# Patient Record
Sex: Female | Born: 1995 | Race: Black or African American | Hispanic: No | Marital: Single | Smoking: Never smoker
Health system: Southern US, Community
[De-identification: ages and names within clinical notes are randomized; demographics above are authoritative.]

## PROBLEM LIST (undated history)

## (undated) DIAGNOSIS — N83209 Unspecified ovarian cyst, unspecified side: Secondary | ICD-10-CM

## (undated) HISTORY — DX: Unspecified ovarian cyst, unspecified side: N83.209

## (undated) HISTORY — PX: NO PAST SURGERIES: SHX2092

## (undated) HISTORY — PX: THERAPEUTIC ABORTION: SHX798

---

## 2015-04-07 ENCOUNTER — Emergency Department (HOSPITAL_COMMUNITY)
Admission: EM | Admit: 2015-04-07 | Discharge: 2015-04-07 | Disposition: A | Discharge status: Home / Self Care | Payer: Medicaid Other | Attending: Emergency Medicine | Admitting: Emergency Medicine

## 2015-04-07 ENCOUNTER — Encounter (HOSPITAL_COMMUNITY): Payer: Self-pay | Admitting: Emergency Medicine

## 2015-04-07 ENCOUNTER — Emergency Department (HOSPITAL_COMMUNITY): Payer: Medicaid Other

## 2015-04-07 DIAGNOSIS — N83201 Unspecified ovarian cyst, right side: Secondary | ICD-10-CM | POA: Insufficient documentation

## 2015-04-07 DIAGNOSIS — Z3202 Encounter for pregnancy test, result negative: Secondary | ICD-10-CM | POA: Insufficient documentation

## 2015-04-07 DIAGNOSIS — R1031 Right lower quadrant pain: Secondary | ICD-10-CM | POA: Diagnosis present

## 2015-04-07 DIAGNOSIS — N83209 Unspecified ovarian cyst, unspecified side: Secondary | ICD-10-CM

## 2015-04-07 LAB — CBC WITH DIFFERENTIAL/PLATELET
BASOS ABS: 0 10*3/uL (ref 0.0–0.1)
Basophils Relative: 0 %
EOS PCT: 1 %
Eosinophils Absolute: 0.1 10*3/uL (ref 0.0–0.7)
HCT: 32.7 % — ABNORMAL LOW (ref 36.0–46.0)
Hemoglobin: 11 g/dL — ABNORMAL LOW (ref 12.0–15.0)
LYMPHS PCT: 14 %
Lymphs Abs: 1.5 10*3/uL (ref 0.7–4.0)
MCH: 28.2 pg (ref 26.0–34.0)
MCHC: 33.6 g/dL (ref 30.0–36.0)
MCV: 83.8 fL (ref 78.0–100.0)
MONO ABS: 0.9 10*3/uL (ref 0.1–1.0)
Monocytes Relative: 9 %
Neutro Abs: 8.2 10*3/uL — ABNORMAL HIGH (ref 1.7–7.7)
Neutrophils Relative %: 76 %
PLATELETS: 234 10*3/uL (ref 150–400)
RBC: 3.9 MIL/uL (ref 3.87–5.11)
RDW: 14.6 % (ref 11.5–15.5)
WBC: 10.7 10*3/uL — ABNORMAL HIGH (ref 4.0–10.5)

## 2015-04-07 LAB — COMPREHENSIVE METABOLIC PANEL
ALK PHOS: 60 U/L (ref 38–126)
ALT: 12 U/L — AB (ref 14–54)
AST: 17 U/L (ref 15–41)
Albumin: 4.1 g/dL (ref 3.5–5.0)
Anion gap: 5 (ref 5–15)
BILIRUBIN TOTAL: 0.7 mg/dL (ref 0.3–1.2)
BUN: 10 mg/dL (ref 6–20)
CO2: 29 mmol/L (ref 22–32)
CREATININE: 0.56 mg/dL (ref 0.44–1.00)
Calcium: 9.4 mg/dL (ref 8.9–10.3)
Chloride: 103 mmol/L (ref 101–111)
GFR calc Af Amer: >60 mL/min (ref >60)
Glucose, Bld: 88 mg/dL (ref 65–99)
Potassium: 3.8 mmol/L (ref 3.5–5.1)
Sodium: 137 mmol/L (ref 135–145)
TOTAL PROTEIN: 7.9 g/dL (ref 6.5–8.1)

## 2015-04-07 LAB — URINALYSIS, ROUTINE W REFLEX MICROSCOPIC
Bilirubin Urine: NEGATIVE
GLUCOSE, UA: NEGATIVE mg/dL
HGB URINE DIPSTICK: NEGATIVE
KETONES UR: NEGATIVE mg/dL
Leukocytes, UA: NEGATIVE
Nitrite: NEGATIVE
PROTEIN: NEGATIVE mg/dL
Specific Gravity, Urine: 1.015 (ref 1.005–1.030)
Urobilinogen, UA: 0.2 mg/dL (ref 0.0–1.0)
pH: 6 (ref 5.0–8.0)

## 2015-04-07 LAB — POC URINE PREG, ED: Preg Test, Ur: NEGATIVE

## 2015-04-07 MED ORDER — IOHEXOL 300 MG/ML  SOLN
50.0000 mL | Freq: Once | INTRAMUSCULAR | Status: AC | PRN
  Administered 2015-04-07: 50 mL via ORAL

## 2015-04-07 MED ORDER — IOHEXOL 300 MG/ML  SOLN
100.0000 mL | Freq: Once | INTRAMUSCULAR | Status: AC | PRN
  Administered 2015-04-07: 100 mL via INTRAVENOUS

## 2015-04-07 NOTE — ED Notes (Signed)
Pt from home c/o generalized abdominal pain and pain when having a stool or urinating since yesterday. Pt is unable to describe this pain but reports no dysuria. Reports nausea but denies vomiting or diarrhea

## 2015-04-07 NOTE — ED Provider Notes (Signed)
CSN: [id number]     Arrival date & time 04/07/15  [id number] History   First MD Initiated Contact with Patient 04/07/15 [phone]     Chief Complaint  Patient presents with  . Abdominal Pain     The history is provided by the patient. No language interpreter was used.   Ms. Megan Strong is a 19 year old woman with no significant medical or surgical history presents for evaluation of abdominal pain. She reports 2 days of lower and right lower quadrant abdominal pain. The pain is sharp and stabbing in nature. Pain is worse with urination and bowel movements. She has associated nausea and constipation. She denies any fevers, vomiting, urinary frequency, vaginal discharge. She is not sexually active. No prior similar symptoms. Symptoms are moderate, constant, worsening. She took Advil, 400 mg this morning without any relief.  History reviewed. No pertinent past medical history. History reviewed. No pertinent past surgical history. No family history on file. Social History  Substance Use Topics  . Smoking status: Never Smoker   . Smokeless tobacco: None  . Alcohol Use: Yes     Comment: social   OB History    No data available     Review of Systems  All other systems reviewed and are negative.     Allergies  Review of patient's allergies indicates no known allergies.  Home Medications   Prior to Admission medications   Not on File   BP 112/71 mmHg  Pulse 84  Temp(Src) 98.2 F (36.8 C) (Oral)  Resp 18  Wt 108 lb (48.988 kg)  SpO2 99%  LMP 02/27/2015 Physical Exam  Constitutional: She is oriented to person, place, and time. She appears well-developed and well-nourished.  HENT:  Head: Normocephalic and atraumatic.  Cardiovascular: Normal rate and regular rhythm.   No murmur heard. Pulmonary/Chest: Effort normal and breath sounds normal. No respiratory distress.  Abdominal: Soft.  Mild to moderate right lower quadrant tenderness without guarding or rebound  Musculoskeletal: She  exhibits no edema or tenderness.  Neurological: She is alert and oriented to person, place, and time.  Skin: Skin is warm and dry.  Psychiatric: She has a normal mood and affect. Her behavior is normal.  Nursing note and vitals reviewed.   ED Course  Procedures (including critical care time) Labs Review Labs Reviewed  COMPREHENSIVE METABOLIC PANEL - Abnormal; Notable for the following:    ALT 12 (*)    All other components within normal limits  URINALYSIS, ROUTINE W REFLEX MICROSCOPIC (NOT AT [facility]) - Abnormal; Notable for the following:    APPearance CLOUDY (*)    All other components within normal limits  CBC WITH DIFFERENTIAL/PLATELET - Abnormal; Notable for the following:    WBC 10.7 (*)    Hemoglobin 11.0 (*)    HCT 32.7 (*)    Neutro Abs 8.2 (*)    All other components within normal limits  POC URINE PREG, ED    Imaging Review No results found. I have personally reviewed and evaluated these images and lab results as part of my medical decision-making.   EKG Interpretation None      MDM   Final diagnoses:  Ovarian cyst rupture    Patient here for evaluation of lower abdominal pain/right lower quadrant pain. CT scan obtained to rule out appendicitis. CT with no evidence of appendicitis but does demonstrate likely ovarian cyst rupture. Patient without peritoneal signs on examination, presentation are consistent with torsion, hemodynamically stable. Discussed with patient home care for ovarian cyst rupture  as well as outpatient follow-up.  Tilden Fossa, MD 04/07/15 1249

## 2015-04-07 NOTE — Discharge Instructions (Signed)
Ovarian Cyst An ovarian cyst is a fluid-filled sac that forms on an ovary. The ovaries are small organs that produce eggs in women. Various types of cysts can form on the ovaries. Most are not cancerous. Many do not cause problems, and they often go away on their own. Some may cause symptoms and require treatment. Common types of ovarian cysts include:  Functional cysts--These cysts may occur every month during the menstrual cycle. This is normal. The cysts usually go away with the next menstrual cycle if the woman does not get pregnant. Usually, there are no symptoms with a functional cyst.  Endometrioma cysts--These cysts form from the tissue that lines the uterus. They are also called "chocolate cysts" because they become filled with blood that turns brown. This type of cyst can cause pain in the lower abdomen during intercourse and with your menstrual period.  Cystadenoma cysts--This type develops from the cells on the outside of the ovary. These cysts can get very big and cause lower abdomen pain and pain with intercourse. This type of cyst can twist on itself, cut off its blood supply, and cause severe pain. It can also easily rupture and cause a lot of pain.  Dermoid cysts--This type of cyst is sometimes found in both ovaries. These cysts may contain different kinds of body tissue, such as skin, teeth, hair, or cartilage. They usually do not cause symptoms unless they get very big.  Theca lutein cysts--These cysts occur when too much of a certain hormone (human chorionic gonadotropin) is produced and overstimulates the ovaries to produce an egg. This is most common after procedures used to assist with the conception of a baby (in vitro fertilization). CAUSES   Fertility drugs can cause a condition in which multiple large cysts are formed on the ovaries. This is called ovarian hyperstimulation syndrome.  A condition called polycystic ovary syndrome can cause hormonal imbalances that can lead to  nonfunctional ovarian cysts. SIGNS AND SYMPTOMS  Many ovarian cysts do not cause symptoms. If symptoms are present, they may include:  Pelvic pain or pressure.  Pain in the lower abdomen.  Pain during sexual intercourse.  Increasing girth (swelling) of the abdomen.  Abnormal menstrual periods.  Increasing pain with menstrual periods.  Stopping having menstrual periods without being pregnant. DIAGNOSIS  These cysts are commonly found during a routine or annual pelvic exam. Tests may be ordered to find out more about the cyst. These tests may include:  Ultrasound.  X-ray of the pelvis.  CT scan.  MRI.  Blood tests. TREATMENT  Many ovarian cysts go away on their own without treatment. Your health care provider may want to check your cyst regularly for 2-3 months to see if it changes. For women in menopause, it is particularly important to monitor a cyst closely because of the higher rate of ovarian cancer in menopausal women. When treatment is needed, it may include any of the following:  A procedure to drain the cyst (aspiration). This may be done using a long needle and ultrasound. It can also be done through a laparoscopic procedure. This involves using a thin, lighted tube with a tiny camera on the end (laparoscope) inserted through a small incision.  Surgery to remove the whole cyst. This may be done using laparoscopic surgery or an open surgery involving a larger incision in the lower abdomen.  Hormone treatment or birth control pills. These methods are sometimes used to help dissolve a cyst. HOME CARE INSTRUCTIONS   Only take over-the-counter   or prescription medicines as directed by your health care provider.  Follow up with your health care provider as directed.  Get regular pelvic exams and Pap tests. SEEK MEDICAL CARE IF:   Your periods are late, irregular, or painful, or they stop.  Your pelvic pain or abdominal pain does not go away.  Your abdomen becomes  larger or swollen.  You have pressure on your bladder or trouble emptying your bladder completely.  You have pain during sexual intercourse.  You have feelings of fullness, pressure, or discomfort in your stomach.  You lose weight for no apparent reason.  You feel generally ill.  You become constipated.  You lose your appetite.  You develop acne.  You have an increase in body and facial hair.  You are gaining weight, without changing your exercise and eating habits.  You think you are pregnant. SEEK IMMEDIATE MEDICAL CARE IF:   You have increasing abdominal pain.  You feel sick to your stomach (nauseous), and you throw up (vomit).  You develop a fever that comes on suddenly.  You have abdominal pain during a bowel movement.  Your menstrual periods become heavier than usual. MAKE SURE YOU:  Understand these instructions.  Will watch your condition.  Will get help right away if you are not doing well or get worse.   This information is not intended to replace advice given to you by your health care provider. Make sure you discuss any questions you have with your health care provider.   Document Released: 05/13/2005 Document Revised: 05/18/2013 Document Reviewed: 01/18/2013 Elsevier Interactive Patient Education 2016 [employer]  

## 2015-05-17 NOTE — ED Provider Notes (Signed)
12:17 PM I received a call from a radiologist who was doing review of past CT scans. Patient was seen in ED 04/07/15 and had CT performed for right lower quadrant abdominal pain. Diagnosis was ruptured ovarian cyst. On re-read, there was question of early acute appendicitis. I was asked to follow-up with patient regarding her symptoms.   I called and spoke with Ms. Senkbeil and inquired about her abdominal pain. She states that her pain has improved. I explained the situation to her, that radiologist could not rule-out acute appendicitis. I instructed that if she is currently not having any pain, she likely only needs routine recheck. If pain returns, she should follow-up immediately with her doctor or emergency department. She verbalized understanding and thanked me for the call.     Renne CriglerJoshua Jaymison Luber, PA-C 05/17/15 1222  Tilden FossaElizabeth Rees, MD 05/19/15 1447

## 2015-05-28 DIAGNOSIS — N83209 Unspecified ovarian cyst, unspecified side: Secondary | ICD-10-CM

## 2015-05-28 HISTORY — DX: Unspecified ovarian cyst, unspecified side: N83.209

## 2015-06-24 ENCOUNTER — Emergency Department (HOSPITAL_COMMUNITY)
Admission: EM | Admit: 2015-06-24 | Discharge: 2015-06-24 | Disposition: A | Discharge status: Home / Self Care | Payer: Medicaid Other | Attending: Emergency Medicine | Admitting: Emergency Medicine

## 2015-06-24 ENCOUNTER — Emergency Department (HOSPITAL_COMMUNITY): Payer: Medicaid Other

## 2015-06-24 ENCOUNTER — Encounter (HOSPITAL_COMMUNITY): Payer: Self-pay | Admitting: Emergency Medicine

## 2015-06-24 DIAGNOSIS — R51 Headache: Secondary | ICD-10-CM | POA: Diagnosis present

## 2015-06-24 DIAGNOSIS — B349 Viral infection, unspecified: Secondary | ICD-10-CM | POA: Diagnosis not present

## 2015-06-24 DIAGNOSIS — R42 Dizziness and giddiness: Secondary | ICD-10-CM | POA: Diagnosis not present

## 2015-06-24 DIAGNOSIS — R519 Headache, unspecified: Secondary | ICD-10-CM

## 2015-06-24 DIAGNOSIS — Z79899 Other long term (current) drug therapy: Secondary | ICD-10-CM | POA: Diagnosis not present

## 2015-06-24 MED ORDER — KETOROLAC TROMETHAMINE 15 MG/ML IJ SOLN
15.0000 mg | Freq: Once | INTRAMUSCULAR | Status: AC
  Administered 2015-06-24: 15 mg via INTRAMUSCULAR
  Filled 2015-06-24: qty 1

## 2015-06-24 NOTE — ED Notes (Signed)
Patient here from home with complaints of nausea, headache x2 days. Dizziness. AAO x4.

## 2015-06-24 NOTE — Discharge Instructions (Signed)
Please read and follow all provided instructions.  Your diagnoses today include:  1. Viral syndrome   2. Nonintractable headache, unspecified chronicity pattern, unspecified headache type    Tests performed today include:  CT of your head which was normal and did not show any serious cause of your headache  Vital signs. See below for your results today.   Medications:  In the Emergency Department you received:  Toradol - NSAID medication similar to ibuprofen  Take any prescribed medications only as directed.  Additional information:  Follow any educational materials contained in this packet.  You are having a headache. No specific cause was found today for your headache. It may have been a migraine or other cause of headache. Stress, anxiety, fatigue, and depression are common triggers for headaches.   Your headache today does not appear to be life-threatening or require hospitalization, but often the exact cause of headaches is not determined in the emergency department. Therefore, follow-up with your doctor is very important to find out what may have caused your headache and whether or not you need any further diagnostic testing or treatment.   Sometimes headaches can appear benign (not harmful), but then more serious symptoms can develop which should prompt an immediate re-evaluation by your doctor or the emergency department.  BE VERY CAREFUL not to take multiple medicines containing Tylenol (also called acetaminophen). Doing so can lead to an overdose which can damage your liver and cause liver failure and possibly death.   Follow-up instructions: Please follow-up with your primary care provider in the next 3 days for further evaluation of your symptoms.   Return instructions:   Please return to the Emergency Department if you experience worsening symptoms.  Return if the medications do not resolve your headache, if it recurs, or if you have multiple episodes of vomiting or  cannot keep down fluids.  Return if you have a change from the usual headache.  RETURN IMMEDIATELY IF you:  Develop a sudden, severe headache  Develop confusion or become poorly responsive or faint  Develop a fever above 100.55F or problem breathing  Have a change in speech, vision, swallowing, or understanding  Develop new weakness, numbness, tingling, incoordination in your arms or legs  Have a seizure  Please return if you have any other emergent concerns.  Additional Information:  Your vital signs today were: BP 124/75 mmHg   Pulse 72   Temp(Src) 97.8 F (36.6 C) (Oral)   Resp 18   SpO2 100%   LMP 05/19/2015 If your blood pressure (BP) was elevated above 135/85 this visit, please have this repeated by your doctor within one month. --------------

## 2015-06-24 NOTE — ED Provider Notes (Signed)
CSN: [id number]     Arrival date & time 06/24/15  1113 History   First MD Initiated Contact with Patient 06/24/15 1127     Chief Complaint  Patient presents with  . Nausea  . Headache  . Dizziness   (Consider location/radiation/quality/duration/timing/severity/associated sxs/prior Treatment) HPI  20 y.o. female presents to the Emergency Department today complaining of headache and nausea x2 days with associated dizziness. States that she has been sick recently since Monday with URI symptoms of congestion and reported fever. Afebrile currently. No sick contacts. Headache is frontal in origin and seems to be constant. No neck pain. No numbness/tingling. No tinnitus. Reports some blurry vision. No other symptoms noted.   History reviewed. No pertinent past medical history. History reviewed. No pertinent past surgical history. No family history on file. Social History  Substance Use Topics  . Smoking status: Never Smoker   . Smokeless tobacco: None  . Alcohol Use: Yes     Comment: social   OB History    No data available     Review of Systems ROS reviewed and all are negative for acute change except as noted in the HPI.  Allergies  Review of patient's allergies indicates no known allergies.  Home Medications   Prior to Admission medications   Medication Sig Start Date End Date Taking? Authorizing Provider  IRON PO Take 1 tablet by mouth 3 (three) times a week.    Historical Provider, MD   BP 124/75 mmHg  Pulse 72  Temp(Src) 97.8 F (36.6 C) (Oral)  Resp 18  SpO2 100%   Physical Exam  Constitutional: She is oriented to person, place, and time. She appears well-developed and well-nourished.  HENT:  Head: Normocephalic and atraumatic.  Right Ear: Tympanic membrane, external ear and ear canal normal.  Left Ear: Tympanic membrane, external ear and ear canal normal.  Nose: Nose normal.  Mouth/Throat: Uvula is midline, oropharynx is clear and moist and mucous membranes are  normal.  Eyes: EOM are normal.  Neck: Normal range of motion. Neck supple.  Cardiovascular: Normal rate, regular rhythm, S1 normal, S2 normal and normal heart sounds.   Pulmonary/Chest: Effort normal and breath sounds normal.  Abdominal: Soft.  Musculoskeletal: Normal range of motion.  Neurological: She is alert and oriented to person, place, and time. She has normal strength. No cranial nerve deficit or sensory deficit.  Skin: Skin is warm and dry.  Psychiatric: She has a normal mood and affect. Her behavior is normal. Thought content normal.  Nursing note and vitals reviewed.  ED Course  Procedures (including critical care time) Labs Review Labs Reviewed - No data to display  Imaging Review No results found. I have personally reviewed and evaluated these images and lab results as part of my medical decision-making.   EKG Interpretation None      MDM  I have reviewed relevant imaging studies. I have reviewed the relevant previous healthcare records. I obtained HPI from historian. Patient discussed with supervising physician  ED Course: CXR   Assessment: 19y F presents with headache for the past two days. Noted URI symptoms since Monday. Patient without high-risk features of headache including: Sudden onset/thunderclap HA, No similar headache in past, Altered mental status, Accompanying seizure, Headache with exertion, Age > 50, History of immunocompromise, Neck or shoulder pain, Fever, Use of anticoagulation, Family history of spontaneous SAH, Concomitant drug use, Toxic exposure. Most likely headache from viral URI that she has had since Monday. Will have her follow up with PCP  for further management if symptoms persist.   Patient has a normal complete neurological exam, normal vital signs, normal level of consciousness, no signs of meningismus, is well-appearing/non-toxic appearing, no signs of trauma, no papilledema, no pain over the temporal arteries.  Imaging with CT/MRI not  indicated given history and physical exam findings.  No dangerous or life-threatening conditions suspected or identified by history, physical exam, and by work-up. No indications for hospitalization identified.   Disposition/Plan:  DC Home Additional Verbal discharge instructions given and discussed with patient.  Pt Instructed to f/u with PCP in the next 48 hours for evaluation and treatment of symptoms. Return precautions given Pt acknowledges and agrees with plan  Supervising Physician Melene Plan, DO   Final diagnoses:  Viral syndrome  Nonintractable headache, unspecified chronicity pattern, unspecified headache type      Audry Pili, PA-C 06/24/15 1305  Melene Plan, DO 06/24/15 1351

## 2015-11-28 ENCOUNTER — Emergency Department (HOSPITAL_COMMUNITY)
Admission: EM | Admit: 2015-11-28 | Discharge: 2015-11-28 | Disposition: A | Discharge status: Home / Self Care | Payer: Medicaid Other | Attending: Emergency Medicine | Admitting: Emergency Medicine

## 2015-11-28 ENCOUNTER — Encounter (HOSPITAL_COMMUNITY): Payer: Self-pay | Admitting: Emergency Medicine

## 2015-11-28 DIAGNOSIS — N390 Urinary tract infection, site not specified: Secondary | ICD-10-CM | POA: Insufficient documentation

## 2015-11-28 DIAGNOSIS — Z79899 Other long term (current) drug therapy: Secondary | ICD-10-CM | POA: Insufficient documentation

## 2015-11-28 DIAGNOSIS — R109 Unspecified abdominal pain: Secondary | ICD-10-CM | POA: Diagnosis present

## 2015-11-28 LAB — URINALYSIS, ROUTINE W REFLEX MICROSCOPIC
BILIRUBIN URINE: NEGATIVE
GLUCOSE, UA: NEGATIVE mg/dL
KETONES UR: NEGATIVE mg/dL
NITRITE: NEGATIVE
Protein, ur: 100 mg/dL — AB
Specific Gravity, Urine: 1.02 (ref 1.005–1.030)
pH: 7 (ref 5.0–8.0)

## 2015-11-28 LAB — CBC
HEMATOCRIT: 34 % — AB (ref 36.0–46.0)
Hemoglobin: 11.7 g/dL — ABNORMAL LOW (ref 12.0–15.0)
MCH: 28 pg (ref 26.0–34.0)
MCHC: 34.4 g/dL (ref 30.0–36.0)
MCV: 81.3 fL (ref 78.0–100.0)
Platelets: 266 10*3/uL (ref 150–400)
RBC: 4.18 MIL/uL (ref 3.87–5.11)
RDW: 14.1 % (ref 11.5–15.5)
WBC: 4.8 10*3/uL (ref 4.0–10.5)

## 2015-11-28 LAB — COMPREHENSIVE METABOLIC PANEL
ALBUMIN: 4.3 g/dL (ref 3.5–5.0)
ALT: 14 U/L (ref 14–54)
AST: 18 U/L (ref 15–41)
Alkaline Phosphatase: 48 U/L (ref 38–126)
Anion gap: 6 (ref 5–15)
BILIRUBIN TOTAL: 0.8 mg/dL (ref 0.3–1.2)
BUN: 11 mg/dL (ref 6–20)
CO2: 26 mmol/L (ref 22–32)
CREATININE: 0.62 mg/dL (ref 0.44–1.00)
Calcium: 9.3 mg/dL (ref 8.9–10.3)
Chloride: 107 mmol/L (ref 101–111)
GFR calc Af Amer: >60 mL/min (ref >60)
GFR calc non Af Amer: >60 mL/min (ref >60)
GLUCOSE: 72 mg/dL (ref 65–99)
POTASSIUM: 3.5 mmol/L (ref 3.5–5.1)
Sodium: 139 mmol/L (ref 135–145)
TOTAL PROTEIN: 7.7 g/dL (ref 6.5–8.1)

## 2015-11-28 LAB — URINE MICROSCOPIC-ADD ON

## 2015-11-28 LAB — LIPASE, BLOOD: Lipase: 37 U/L (ref 11–51)

## 2015-11-28 MED ORDER — CEPHALEXIN 500 MG PO CAPS: 500.0000 mg | ORAL_CAPSULE | Freq: Four times a day (QID) | ORAL | Status: DC

## 2015-11-28 NOTE — ED Notes (Signed)
Per patient, she has abdominal pain that radiates to the back on her right side.  Denies n/v/d.  Headache started 2 days ago also.  Denies trauma, injury, LOC, and blurred vision.

## 2015-11-28 NOTE — ED Provider Notes (Signed)
CSN: [id number]     Arrival date & time 11/28/15  1316 History   First MD Initiated Contact with Patient 11/28/15 1340     Chief Complaint  Patient presents with  . Abdominal Pain  . Headache  . Urinary Tract Infection     (Consider location/radiation/quality/duration/timing/severity/associated sxs/prior Treatment) Patient is a 20 y.o. female presenting with flank pain. The history is provided by the patient. No language interpreter was used.  Flank Pain This is a new problem. The current episode started today. The problem occurs constantly. The problem has been gradually worsening. Associated symptoms include abdominal pain. Pertinent negatives include no chills, fever or vomiting. Nothing aggravates the symptoms. She has tried nothing for the symptoms. The treatment provided moderate relief.  Pt complains of soreness in her right lower back.  Pt reports she noticed blood in her urine yesterday.   History reviewed. No pertinent past medical history. History reviewed. No pertinent past surgical history. No family history on file. Social History  Substance Use Topics  . Smoking status: Never Smoker   . Smokeless tobacco: None  . Alcohol Use: Yes     Comment: social   OB History    No data available     Review of Systems  Constitutional: Negative for fever and chills.  Gastrointestinal: Positive for abdominal pain. Negative for vomiting.  Genitourinary: Positive for flank pain.  All other systems reviewed and are negative.     Allergies  Review of patient's allergies indicates no known allergies.  Home Medications   Prior to Admission medications   Medication Sig Start Date End Date Taking? Authorizing Provider  Ibuprofen-Diphenhydramine Cit (ADVIL PM) 200-38 MG TABS Take 2 tablets by mouth at bedtime as needed (pain).   Yes Historical Provider, MD  DM-Doxylamine-Acetaminophen (VICKS NYQUIL COLD & FLU) 15-6.25-325 MG/15ML LIQD Take 5 mLs by mouth every 6 (six) hours as  needed (cold symptoms).    Historical Provider, MD   BP 120/80 mmHg  Pulse 59  Temp(Src) 98.4 F (36.9 C) (Oral)  Resp 16  SpO2 100%  LMP 10/31/2015 Physical Exam  Constitutional: She is oriented to person, place, and time. She appears well-developed and well-nourished.  HENT:  Head: Normocephalic.  Eyes: EOM are normal. Pupils are equal, round, and reactive to light.  Neck: Normal range of motion.  Cardiovascular: Normal rate.   Pulmonary/Chest: Effort normal.  Abdominal: Soft. She exhibits no distension. There is tenderness.  Soft nontender,  No cva tenderness  Musculoskeletal: Normal range of motion.  Neurological: She is alert and oriented to person, place, and time.  Psychiatric: She has a normal mood and affect.  Nursing note and vitals reviewed.   ED Course  Procedures (including critical care time) Labs Review Labs Reviewed  CBC - Abnormal; Notable for the following:    Hemoglobin 11.7 (*)    HCT 34.0 (*)    All other components within normal limits  URINALYSIS, ROUTINE W REFLEX MICROSCOPIC (NOT AT [facility]) - Abnormal; Notable for the following:    APPearance HAZY (*)    Hgb urine dipstick MODERATE (*)    Protein, ur 100 (*)    Leukocytes, UA MODERATE (*)    All other components within normal limits  URINE MICROSCOPIC-ADD ON - Abnormal; Notable for the following:    Squamous Epithelial / LPF 0-5 (*)    Bacteria, UA FEW (*)    All other components within normal limits  LIPASE, BLOOD  COMPREHENSIVE METABOLIC PANEL    Imaging Review No  results found. I have personally reviewed and evaluated these images and lab results as part of my medical decision-making.   EKG Interpretation None      MDM   Final diagnoses:  UTI (lower urinary tract infection)    Meds ordered this encounter  Medications  . Ibuprofen-Diphenhydramine Cit (ADVIL PM) 200-38 MG TABS    Sig: Take 2 tablets by mouth at bedtime as needed (pain).  . cephALEXin (KEFLEX) 500 MG capsule     Sig: Take 1 capsule (500 mg total) by mouth 4 (four) times daily.    Dispense:  40 capsule    Refill:  0    Order Specific Question:  Supervising Provider    Answer:  Eber HongMILLER, BRIAN [[id number]]      Lonia SkinnerLeslie K SpringfieldSofia, PA-C 11/28/15 1720  Lorre NickAnthony Allen, MD 12/01/15 [phone]

## 2015-11-28 NOTE — Discharge Instructions (Signed)

## 2016-08-05 ENCOUNTER — Emergency Department (HOSPITAL_COMMUNITY)
Admission: EM | Admit: 2016-08-05 | Discharge: 2016-08-05 | Disposition: A | Discharge status: Home / Self Care | Payer: Medicaid Other | Attending: Emergency Medicine | Admitting: Emergency Medicine

## 2016-08-05 ENCOUNTER — Encounter (HOSPITAL_COMMUNITY): Payer: Self-pay | Admitting: Family Medicine

## 2016-08-05 DIAGNOSIS — Z79899 Other long term (current) drug therapy: Secondary | ICD-10-CM | POA: Diagnosis not present

## 2016-08-05 DIAGNOSIS — K5901 Slow transit constipation: Secondary | ICD-10-CM | POA: Insufficient documentation

## 2016-08-05 DIAGNOSIS — R1032 Left lower quadrant pain: Secondary | ICD-10-CM

## 2016-08-05 DIAGNOSIS — R109 Unspecified abdominal pain: Secondary | ICD-10-CM | POA: Diagnosis present

## 2016-08-05 LAB — URINALYSIS, ROUTINE W REFLEX MICROSCOPIC
BACTERIA UA: NONE SEEN
BILIRUBIN URINE: NEGATIVE
Glucose, UA: NEGATIVE mg/dL
Hgb urine dipstick: NEGATIVE
KETONES UR: NEGATIVE mg/dL
Nitrite: NEGATIVE
PH: 6 (ref 5.0–8.0)
Protein, ur: NEGATIVE mg/dL
Specific Gravity, Urine: 1.015 (ref 1.005–1.030)

## 2016-08-05 LAB — PREGNANCY, URINE: PREG TEST UR: NEGATIVE

## 2016-08-05 MED ORDER — IBUPROFEN 800 MG PO TABS
800.0000 mg | ORAL_TABLET | Freq: Once | ORAL | Status: AC
  Administered 2016-08-05: 800 mg via ORAL
  Filled 2016-08-05: qty 1

## 2016-08-05 MED ORDER — POLYETHYLENE GLYCOL 3350 17 GM/SCOOP PO POWD: ORAL | 0 refills | Status: DC

## 2016-08-05 MED ORDER — IBUPROFEN 800 MG PO TABS: 800.0000 mg | ORAL_TABLET | Freq: Three times a day (TID) | ORAL | 0 refills | Status: DC | PRN

## 2016-08-05 NOTE — ED Triage Notes (Signed)
Patient is complaining of left lower quad pain that return 3 days ago. Pt denies nausea, vomiting, diarrhea, or fever. Occasional constipation and cramping. Pt denies seeking medical treatment for the symptoms previously. While in triage, pt is using cell phone.

## 2016-08-05 NOTE — ED Provider Notes (Signed)
[facility]-EMERGENCY DEPT Provider Note   CSN: [id number] Arrival date & time: 08/05/16  1901     History   Chief Complaint Chief Complaint  Patient presents with  . Abdominal Pain    HPI Megan Strong is a 21 y.o. female.  The history is provided by the patient.  Constipation   This is a new problem. Episode onset: 1 week ago. The stool is described as firm. Associated symptoms include abdominal pain (left sided). There is no fiber in the patient's diet. She does not exercise regularly. She has tried nothing for the symptoms.    History reviewed. No pertinent past medical history.  There are no active problems to display for this patient.   History reviewed. No pertinent surgical history.  OB History    No data available       Home Medications    Prior to Admission medications   Medication Sig Start Date End Date Taking? Authorizing Provider  cephALEXin (KEFLEX) 500 MG capsule Take 1 capsule (500 mg total) by mouth 4 (four) times daily. Patient not taking: Reported on 08/05/2016 11/28/15   Elson AreasLeslie K Sofia, PA-C  DM-Doxylamine-Acetaminophen (VICKS NYQUIL COLD & FLU) 15-6.25-325 MG/15ML LIQD Take 5 mLs by mouth every 6 (six) hours as needed (cold symptoms).    Historical Provider, MD  Ibuprofen-Diphenhydramine Cit (ADVIL PM) 200-38 MG TABS Take 2 tablets by mouth at bedtime as needed (pain).    Historical Provider, MD    Family History History reviewed. No pertinent family history.  Social History Social History  Substance Use Topics  . Smoking status: Never Smoker  . Smokeless tobacco: Never Used  . Alcohol use No     Allergies   Patient has no known allergies.   Review of Systems Review of Systems  Gastrointestinal: Positive for abdominal pain (left sided) and constipation.  All other systems reviewed and are negative.    Physical Exam Updated Vital Signs BP 116/70 (BP Location: Right Arm)   Pulse 99   Temp 98 F (36.7 C) (Oral)   Resp 16   Ht 5'  5.5" (1.664 m)   Wt 113 lb (51.3 kg)   LMP 07/08/2016   SpO2 100%   BMI 18.52 kg/m   Physical Exam  Constitutional: She is oriented to person, place, and time. She appears well-developed and well-nourished. No distress.  HENT:  Head: Normocephalic.  Nose: Nose normal.  Eyes: Conjunctivae are normal.  Neck: Neck supple. No tracheal deviation present.  Cardiovascular: Normal rate, regular rhythm and normal heart sounds.   Pulmonary/Chest: Effort normal and breath sounds normal. No respiratory distress.  Abdominal: Soft. She exhibits no distension. There is no tenderness. There is no rebound and no guarding.  Neurological: She is alert and oriented to person, place, and time.  Skin: Skin is warm and dry.  Psychiatric: She has a normal mood and affect.  Vitals reviewed.    ED Treatments / Results  Labs (all labs ordered are listed, but only abnormal results are displayed) Labs Reviewed  URINALYSIS, ROUTINE W REFLEX MICROSCOPIC - Abnormal; Notable for the following:       Result Value   Leukocytes, UA TRACE (*)    Squamous Epithelial / LPF 0-5 (*)    All other components within normal limits  PREGNANCY, URINE  POC URINE PREG, ED    EKG  EKG Interpretation None       Radiology No results found.  Procedures Procedures (including critical care time)  Medications Ordered in ED Medications  ibuprofen (ADVIL,MOTRIN) tablet 800 mg (800 mg Oral Given 08/05/16 2018)     Initial Impression / Assessment and Plan / ED Course  I have reviewed the triage vital signs and the nursing notes.  Pertinent labs & imaging results that were available during my care of the patient were reviewed by me and considered in my medical decision making (see chart for details).     21 y.o. female presents with LLQ pain and hard stools with BMs every other day preceding symptoms. Had pain previously which resolved spontaneously. Well appearing. No significant tenderness here. Suspect  constipation. Will plan for bowel cleanout. Plan to follow up with PCP as needed and return precautions discussed for worsening or new concerning symptoms.   Final Clinical Impressions(s) / ED Diagnoses   Final diagnoses:  Abdominal pain, acute, left lower quadrant  Slow transit constipation    New Prescriptions Discharge Medication List as of 08/05/2016  8:08 PM    START taking these medications   Details  ibuprofen (ADVIL,MOTRIN) 800 MG tablet Take 1 tablet (800 mg total) by mouth every 8 (eight) hours as needed for mild pain, moderate pain or cramping., Starting Mon 08/05/2016, Print    polyethylene glycol powder (MIRALAX) powder TAKE 6 CAPFULS OF MIRALAX IN A 32 OUNCE GATORADE AND DRINK THE WHOLE BEVERAGE FOLLOWED BY 3 CAPFULS TWICE A DAY FOR THE NEXT WEEK AND FOLLOW UP WITH YOUR PRIMARY CARE PHYSICIAN., Print         Lyndal Pulley, MD 08/06/16 0157

## 2016-08-24 ENCOUNTER — Emergency Department (HOSPITAL_COMMUNITY): Payer: Medicaid Other

## 2016-08-24 ENCOUNTER — Emergency Department (HOSPITAL_COMMUNITY)
Admission: EM | Admit: 2016-08-24 | Discharge: 2016-08-24 | Disposition: A | Discharge status: Home / Self Care | Payer: Medicaid Other | Attending: Emergency Medicine | Admitting: Emergency Medicine

## 2016-08-24 ENCOUNTER — Encounter (HOSPITAL_COMMUNITY): Payer: Self-pay

## 2016-08-24 DIAGNOSIS — R109 Unspecified abdominal pain: Secondary | ICD-10-CM

## 2016-08-24 DIAGNOSIS — N939 Abnormal uterine and vaginal bleeding, unspecified: Secondary | ICD-10-CM | POA: Diagnosis not present

## 2016-08-24 DIAGNOSIS — Z79899 Other long term (current) drug therapy: Secondary | ICD-10-CM | POA: Insufficient documentation

## 2016-08-24 LAB — URINALYSIS, ROUTINE W REFLEX MICROSCOPIC
Bacteria, UA: NONE SEEN
Bilirubin Urine: NEGATIVE
GLUCOSE, UA: NEGATIVE mg/dL
Ketones, ur: NEGATIVE mg/dL
Leukocytes, UA: NEGATIVE
Nitrite: NEGATIVE
PH: 7 (ref 5.0–8.0)
PROTEIN: NEGATIVE mg/dL
Specific Gravity, Urine: 1.013 (ref 1.005–1.030)

## 2016-08-24 LAB — WET PREP, GENITAL
Clue Cells Wet Prep HPF POC: NONE SEEN
SPERM: NONE SEEN
TRICH WET PREP: NONE SEEN
Yeast Wet Prep HPF POC: NONE SEEN

## 2016-08-24 LAB — POC URINE PREG, ED: Preg Test, Ur: NEGATIVE

## 2016-08-24 NOTE — Discharge Instructions (Signed)
It was my pleasure taking care of you today!   Ibuprofen as needed for pain.   Please follow up with your primary doctor or the OBGYN clinic listed for further discussion about your hospital visit today. Please return to the ER for new or worsening symptoms, any additional concerns.

## 2016-08-24 NOTE — ED Notes (Signed)
PT DISCHARGED. INSTRUCTIONS GIVEN. AAOX4. PT IN NO APPARENT DISTRESS OR PAIN. THE OPPORTUNITY TO ASK QUESTIONS WAS PROVIDED. 

## 2016-08-24 NOTE — ED Triage Notes (Signed)
She c/o vag. Spotting and lower abd. Cramping x ~ 2 days. She is in no distress. She mentions having T.A.B. In Jan. Of this year.

## 2016-08-24 NOTE — ED Provider Notes (Signed)
[facility]-EMERGENCY DEPT Provider Note   CSN: [id number] Arrival date & time: 08/24/16  1045     History   Chief Complaint Chief Complaint  Patient presents with  . Vaginal Bleeding    HPI Megan Strong is a 21 y.o. female.  The history is provided by the patient and medical records. No language interpreter was used.    Megan Strong is an otherwise healthy 21 y.o. female who presents to the Emergency Department complaining of intermittent sharp bilateral abdominal pain 2 months. Pain lasts a few seconds to minutes, then will resolve. She was seen in the ER for same a few days ago and told this was likely due to constipation. She has been having 2-3 bowel movements a day since then, however pain persists. This morning, she started experiencing lower abdominal cramping and vaginal bleeding. This was concerning for her as her menstrual period just ended last week. She typically has very regular cycles and does not spot between the cycles. She states this morning when she went to the restroom, she noticed blood when wiping. She then put on a pad and had some spotting during the morning. Denies clots. She denies any vaginal discharge, dysuria, urinary urgency/frequency, fevers, nausea/vomiting or back pain. No blood in stool. She's been taking ibuprofen with little relief in pain. No aggravating or alleviating factors noted.    History reviewed. No pertinent past medical history.  There are no active problems to display for this patient.   Past Surgical History:  Procedure Laterality Date  . THERAPEUTIC ABORTION      OB History    No data available       Home Medications    Prior to Admission medications   Medication Sig Start Date End Date Taking? Authorizing Provider  ibuprofen (ADVIL,MOTRIN) 200 MG tablet Take 400 mg by mouth every 6 (six) hours as needed for mild pain.   Yes Historical Provider, MD  ibuprofen (ADVIL,MOTRIN) 800 MG tablet Take 1 tablet (800 mg total) by  mouth every 8 (eight) hours as needed for mild pain, moderate pain or cramping. Patient not taking: Reported on 08/24/2016 08/05/16   Lyndal Pulley, MD  polyethylene glycol powder (MIRALAX) powder TAKE 6 CAPFULS OF MIRALAX IN A 32 OUNCE GATORADE AND DRINK THE WHOLE BEVERAGE FOLLOWED BY 3 CAPFULS TWICE A DAY FOR THE NEXT WEEK AND FOLLOW UP WITH YOUR PRIMARY CARE PHYSICIAN. Patient not taking: Reported on 08/24/2016 08/05/16   Lyndal Pulley, MD    Family History History reviewed. No pertinent family history.  Social History Social History  Substance Use Topics  . Smoking status: Never Smoker  . Smokeless tobacco: Never Used  . Alcohol use No     Allergies   Patient has no known allergies.   Review of Systems Review of Systems  Constitutional: Negative for chills and fever.  HENT: Negative for congestion.   Eyes: Negative for visual disturbance.  Respiratory: Negative for cough and shortness of breath.   Cardiovascular: Negative for chest pain.  Gastrointestinal: Positive for abdominal pain. Negative for blood in stool, constipation, diarrhea, nausea and vomiting.  Genitourinary: Positive for vaginal bleeding. Negative for dysuria, frequency, urgency and vaginal discharge.  Musculoskeletal: Negative for back pain and neck pain.  Skin: Negative for rash.  Neurological: Negative for headaches.     Physical Exam Updated Vital Signs BP 102/66 (BP Location: Right Arm)   Pulse 68   Temp 98.1 F (36.7 C) (Oral)   Resp 16   LMP 08/10/2016 (Exact Date)  SpO2 100%   Physical Exam  Constitutional: She is oriented to person, place, and time. She appears well-developed and well-nourished. No distress.  HENT:  Head: Normocephalic and atraumatic.  Cardiovascular: Normal rate, regular rhythm and normal heart sounds.   No murmur heard. Pulmonary/Chest: Effort normal and breath sounds normal. No respiratory distress. She has no wheezes. She has no rales.  Abdominal: Soft. Bowel sounds are  normal. She exhibits no distension.  Mild lower abdominal tenderness with on rebound or guarding. No CVA tenderness.   Genitourinary:  Genitourinary Comments: Chaperone present for exam. + active bleeding. No discharge appreciated. No CMT. + right adnexal tenderness.   Neurological: She is alert and oriented to person, place, and time.  Skin: Skin is warm and dry.  Nursing note and vitals reviewed.    ED Treatments / Results  Labs (all labs ordered are listed, but only abnormal results are displayed) Labs Reviewed  WET PREP, GENITAL - Abnormal; Notable for the following:       Result Value   WBC, Wet Prep HPF POC FEW (*)    All other components within normal limits  URINALYSIS, ROUTINE W REFLEX MICROSCOPIC - Abnormal; Notable for the following:    Hgb urine dipstick LARGE (*)    Squamous Epithelial / LPF 0-5 (*)    All other components within normal limits  POC URINE PREG, ED  GC/CHLAMYDIA PROBE AMP ([place]) NOT AT [facility]    EKG  EKG Interpretation None       Radiology US Transvaginal Non-ob  Result Date: 08/24/2016 CLINICAL DATA:  Ovarian torsion, left lower quadrant pain for 2 months EXAM: TRANSABDOMINAL AND TRANSVAGINAL ULTRASOUND OF PELVIS DOPPLER ULTRASOUND OF OVARIES TECHNIQUE: Both transabdominal and transvaginal ultrasound examinations of the pelvis were performed. Transabdominal technique was performed for global imaging of the pelvis including uterus, ovaries, adnexal regions, and pelvic cul-de-sac. It was necessary to proceed with endovaginal exam following the transabdominal exam to visualize the endometrium and ovaries. Color and duplex Doppler ultrasound was utilized to evaluate blood flow to the ovaries. COMPARISON:  None. FINDINGS: Uterus Measurements: 7.6 x 3.8 x 4.2 cm. No fibroids or other mass visualized. Endometrium Thickness: 2.3 mm.  No focal abnormality visualized. Right ovary Measurements: 4 x 2.5 x 3 cm. Normal appearance/no adnexal mass. Left ovary  Measurements: 2.7 x 3 x 3.9 cm. Normal appearance/no adnexal mass. Pulsed Doppler evaluation of both ovaries demonstrates normal low-resistance arterial and venous waveforms. Other findings Small amount of pelvic fluid in the right adnexal region. IMPRESSION: 1. No acute pelvic abnormality. 2. No ovarian torsion. Electronically Signed   By: Elige Ko   On: 08/24/2016 13:53   US Pelvis Complete  Result Date: 08/24/2016 CLINICAL DATA:  Ovarian torsion, left lower quadrant pain for 2 months EXAM: TRANSABDOMINAL AND TRANSVAGINAL ULTRASOUND OF PELVIS DOPPLER ULTRASOUND OF OVARIES TECHNIQUE: Both transabdominal and transvaginal ultrasound examinations of the pelvis were performed. Transabdominal technique was performed for global imaging of the pelvis including uterus, ovaries, adnexal regions, and pelvic cul-de-sac. It was necessary to proceed with endovaginal exam following the transabdominal exam to visualize the endometrium and ovaries. Color and duplex Doppler ultrasound was utilized to evaluate blood flow to the ovaries. COMPARISON:  None. FINDINGS: Uterus Measurements: 7.6 x 3.8 x 4.2 cm. No fibroids or other mass visualized. Endometrium Thickness: 2.3 mm.  No focal abnormality visualized. Right ovary Measurements: 4 x 2.5 x 3 cm. Normal appearance/no adnexal mass. Left ovary Measurements: 2.7 x 3 x 3.9 cm. Normal appearance/no  adnexal mass. Pulsed Doppler evaluation of both ovaries demonstrates normal low-resistance arterial and venous waveforms. Other findings Small amount of pelvic fluid in the right adnexal region. IMPRESSION: 1. No acute pelvic abnormality. 2. No ovarian torsion. Electronically Signed   By: Elige Ko   On: 08/24/2016 13:53   [place] Art/ven Flow Abd Pelv Doppler  Result Date: 08/24/2016 CLINICAL DATA:  Ovarian torsion, left lower quadrant pain for 2 months EXAM: TRANSABDOMINAL AND TRANSVAGINAL ULTRASOUND OF PELVIS DOPPLER ULTRASOUND OF OVARIES TECHNIQUE: Both transabdominal and  transvaginal ultrasound examinations of the pelvis were performed. Transabdominal technique was performed for global imaging of the pelvis including uterus, ovaries, adnexal regions, and pelvic cul-de-sac. It was necessary to proceed with endovaginal exam following the transabdominal exam to visualize the endometrium and ovaries. Color and duplex Doppler ultrasound was utilized to evaluate blood flow to the ovaries. COMPARISON:  None. FINDINGS: Uterus Measurements: 7.6 x 3.8 x 4.2 cm. No fibroids or other mass visualized. Endometrium Thickness: 2.3 mm.  No focal abnormality visualized. Right ovary Measurements: 4 x 2.5 x 3 cm. Normal appearance/no adnexal mass. Left ovary Measurements: 2.7 x 3 x 3.9 cm. Normal appearance/no adnexal mass. Pulsed Doppler evaluation of both ovaries demonstrates normal low-resistance arterial and venous waveforms. Other findings Small amount of pelvic fluid in the right adnexal region. IMPRESSION: 1. No acute pelvic abnormality. 2. No ovarian torsion. Electronically Signed   By: Elige Ko   On: 08/24/2016 13:53    Procedures Procedures (including critical care time)  Medications Ordered in ED Medications - No data to display   Initial Impression / Assessment and Plan / ED Course  I have reviewed the triage vital signs and the nursing notes.  Pertinent labs & imaging results that were available during my care of the patient were reviewed by me and considered in my medical decision making (see chart for details).    Megan Strong is a 21 y.o. female who presents to ED for abdominal cramping and vaginal bleeding. Bleeding started this morning and described as spotting. She is concerned because she typically has regular cycles and just came off menses last week. Nonsurgical abdomen on exam. Pelvic with active vaginal bleeding. She does have some right-sided adnexal tenderness - given ongoing abdominal pain and adnexal tenderness, U/S was obtained which was negative for  acute abnormalities. She did have a small amount of pelvic fluid in the right adnexal region. UA with on signs of infection. Wet prep with few WBC's but otherwise negative. Evaluation does not show pathology that would require ongoing emergent intervention or inpatient treatment. OBGYN follow up recommended. Reasons to return to ER discussed. All questions answered.   Final Clinical Impressions(s) / ED Diagnoses   Final diagnoses:  Vaginal bleeding  Abdominal cramping    New Prescriptions New Prescriptions   No medications on file     [facility] Lorrin Bodner, PA-C 08/24/16 1457    Loren Racer, MD 08/25/16 [phone]

## 2016-08-26 LAB — GC/CHLAMYDIA PROBE AMP (~~LOC~~) NOT AT ARMC
Chlamydia: NEGATIVE
Neisseria Gonorrhea: NEGATIVE

## 2016-10-01 ENCOUNTER — Emergency Department (HOSPITAL_COMMUNITY)
Admission: EM | Admit: 2016-10-01 | Discharge: 2016-10-01 | Disposition: A | Discharge status: Home / Self Care | Payer: BLUE CROSS/BLUE SHIELD | Attending: Emergency Medicine | Admitting: Emergency Medicine

## 2016-10-01 ENCOUNTER — Emergency Department (HOSPITAL_COMMUNITY): Payer: BLUE CROSS/BLUE SHIELD

## 2016-10-01 ENCOUNTER — Encounter (HOSPITAL_COMMUNITY): Payer: Self-pay

## 2016-10-01 DIAGNOSIS — R0789 Other chest pain: Secondary | ICD-10-CM | POA: Diagnosis not present

## 2016-10-01 DIAGNOSIS — Z79899 Other long term (current) drug therapy: Secondary | ICD-10-CM | POA: Diagnosis not present

## 2016-10-01 DIAGNOSIS — R072 Precordial pain: Secondary | ICD-10-CM | POA: Diagnosis present

## 2016-10-01 LAB — I-STAT TROPONIN, ED: Troponin i, poc: 0 ng/mL (ref 0.00–0.08)

## 2016-10-01 LAB — CBC WITH DIFFERENTIAL/PLATELET
BASOS ABS: 0 10*3/uL (ref 0.0–0.1)
Basophils Relative: 0 %
EOS ABS: 0 10*3/uL (ref 0.0–0.7)
EOS PCT: 1 %
HCT: 32.2 % — ABNORMAL LOW (ref 36.0–46.0)
Hemoglobin: 11 g/dL — ABNORMAL LOW (ref 12.0–15.0)
LYMPHS ABS: 2.1 10*3/uL (ref 0.7–4.0)
Lymphocytes Relative: 48 %
MCH: 28 pg (ref 26.0–34.0)
MCHC: 34.2 g/dL (ref 30.0–36.0)
MCV: 81.9 fL (ref 78.0–100.0)
MONO ABS: 0.4 10*3/uL (ref 0.1–1.0)
Monocytes Relative: 9 %
Neutro Abs: 1.8 10*3/uL (ref 1.7–7.7)
Neutrophils Relative %: 42 %
PLATELETS: 230 10*3/uL (ref 150–400)
RBC: 3.93 MIL/uL (ref 3.87–5.11)
RDW: 14.1 % (ref 11.5–15.5)
WBC: 4.3 10*3/uL (ref 4.0–10.5)

## 2016-10-01 LAB — I-STAT BETA HCG BLOOD, ED (MC, WL, AP ONLY): I-stat hCG, quantitative: 56.8 m[IU]/mL — ABNORMAL HIGH (ref <5)

## 2016-10-01 LAB — HCG, QUANTITATIVE, PREGNANCY: hCG, Beta Chain, Quant, S: 71 m[IU]/mL — ABNORMAL HIGH (ref <5)

## 2016-10-01 NOTE — ED Triage Notes (Addendum)
Patient states intermittent sharp pain on the left side x 6 months. Patient states the pain normally lasts for a few seconds, but today the pain was lasted for a few minutes. Patient also reports nothing in particular causes the chest pain.

## 2016-10-01 NOTE — ED Provider Notes (Signed)
[facility]-EMERGENCY DEPT Provider Note   CSN: [id number] Arrival date & time: 10/01/16  1735     History   Chief Complaint Chief Complaint  Patient presents with  . Chest Pain    HPI Megan Strong is a 21 y.o. female who presents with 6 months of intermittent midsternal chest pain. She states that within the last week chest pain has become more frequent. She states that usually the episodes of chest pain only lasts a few seconds but today at 12pm she had an episode that lasted a few minutes, which concerned her. Patient states she was lying on her bed when pain started. She states that pain is midsternal and does not radiate. She describes it as a "sharp, tingling" to her midsternal region. She she states that the episodes of chest pain occur randomly and not associated with exertion, Anxiety or any particular movement. She states that she does not get nauseous, SOB or diaphoretic during these episodes and that they usually resolve without any intervention. She has not taken any medication for that. She denies any history of trauma or injury preceding the onset of symptoms. She denies any heavy lifting or new exercise. She does not take OCPs. She denies any history of blood clots, recent surgery, recent immobilization, recent travel. She denies smoking any cigarettes, smoking marijuana, cocaine use.  The history is provided by the patient.    History reviewed. No pertinent past medical history.  There are no active problems to display for this patient.   Past Surgical History:  Procedure Laterality Date  . THERAPEUTIC ABORTION      OB History    Gravida Para Term Preterm AB Living   1             SAB TAB Ectopic Multiple Live Births                   Home Medications    Prior to Admission medications   Medication Sig Start Date End Date Taking? Authorizing Provider  CRANBERRY PO Take 1 tablet by mouth every other day.   Yes [provider]  HYDROcodone-acetaminophen  (NORCO/VICODIN) 5-325 MG tablet Take 1-2 tablets by mouth every 6 (six) hours as needed for severe pain. 10/03/16   Mesner, Barbara Cower, MD    Family History Family History  Problem Relation Age of Onset  . Rheum arthritis Mother   . Diabetes Mother   . Rheum arthritis Father     Social History Social History  Substance Use Topics  . Smoking status: Never Smoker  . Smokeless tobacco: Never Used  . Alcohol use No     Allergies   Patient has no known allergies.   Review of Systems Review of Systems  Constitutional: Negative for fever.  Respiratory: Negative for cough and shortness of breath.   Cardiovascular: Positive for chest pain. Negative for leg swelling.  Gastrointestinal: Negative for abdominal pain, nausea and vomiting.  Genitourinary: Negative for dysuria, hematuria and vaginal bleeding.  Neurological: Negative for headaches.  All other systems reviewed and are negative.    Physical Exam Updated Vital Signs BP 109/88 (BP Location: Left Arm)   Pulse 77   Temp 98.1 F (36.7 C) (Oral)   Resp 12   Ht 5' 5.5" (1.664 m)   Wt 50.3 kg   LMP 08/24/2016 Comment: irregular periods  SpO2 100%   BMI 18.19 kg/m   Physical Exam  Constitutional: She appears well-developed and well-nourished.  HENT:  Head: Normocephalic and atraumatic.  Mouth/Throat: Oropharynx is clear and moist and mucous membranes are normal.  Eyes: Conjunctivae and EOM are normal. Right eye exhibits no discharge. Left eye exhibits no discharge. No scleral icterus.  Cardiovascular: Normal rate, regular rhythm and intact distal pulses.  Exam reveals no gallop and no friction rub.   No murmur heard. Pulmonary/Chest: Effort normal and breath sounds normal. No accessory muscle usage. No respiratory distress.  Tenderness to palpation to mid sternal region. No deformity or crepitus noted. No signs of respiratory distress. Able to speak in full sentences without difficulty.  Musculoskeletal: She exhibits no  deformity.  Neurological: She is alert.  Skin: Skin is warm and dry.  Psychiatric: She has a normal mood and affect. Her speech is normal and behavior is normal.     ED Treatments / Results  Labs (all labs ordered are listed, but only abnormal results are displayed) Labs Reviewed  CBC WITH DIFFERENTIAL/PLATELET - Abnormal; Notable for the following:       Result Value   Hemoglobin 11.0 (*)    HCT 32.2 (*)    All other components within normal limits  HCG, QUANTITATIVE, PREGNANCY - Abnormal; Notable for the following:    hCG, Beta Chain, Quant, S 71 (*)    All other components within normal limits  I-STAT BETA HCG BLOOD, ED ([facility], [facility], [facility] ONLY) - Abnormal; Notable for the following:    I-stat hCG, quantitative 56.8 (*)    All other components within normal limits  I-STAT TROPOININ, ED    EKG  EKG Interpretation  Date/Time:  Tuesday Oct 01 2016 18:24:49 EDT Ventricular Rate:  84 PR Interval:    QRS Duration: 100 QT Interval:  373 QTC Calculation: 441 R Axis:   159 Text Interpretation:  Sinus rhythm Probable right ventricular hypertrophy NO stemi  No old tracing to compare Confirmed by [facility] MD, PEDRO [phone]) on 10/01/2016 11:24:34 PM       Radiology US Ob Comp Less 14 Wks  Result Date: 10/03/2016 CLINICAL DATA:  Vaginal bleeding, first trimester of pregnancy. EXAM: OBSTETRIC <14 WK [place] AND TRANSVAGINAL OB US TECHNIQUE: Both transabdominal and transvaginal ultrasound examinations were performed for complete evaluation of the gestation as well as the maternal uterus, adnexal regions, and pelvic cul-de-sac. Transvaginal technique was performed to assess early pregnancy. COMPARISON:  Ultrasound of August 24, 2016. FINDINGS: Intrauterine gestational sac: Not visualized. Yolk sac:  Not visualized. Embryo:  None visualized. Cardiac Activity: Not visualized. Subchorionic hemorrhage:  None visualized. Maternal uterus/adnexae: Left ovary appears normal. 1.8 cm right paraovarian cyst is  noted. Trace free fluid is noted which most likely is physiologic. IMPRESSION: No intrauterine gestational sac, yolk sac, fetal pole, or cardiac activity visualized. Differential considerations include intrauterine gestation too early to be sonographically visualized, spontaneous abortion, or ectopic pregnancy. Consider follow-up ultrasound in 14 days and serial quantitative beta HCG follow-up. Electronically Signed   By: Lupita Raider, M.D.   On: 10/03/2016 07:45   US Ob Transvaginal  Result Date: 10/03/2016 CLINICAL DATA:  Vaginal bleeding, first trimester of pregnancy. EXAM: OBSTETRIC <14 WK [place] AND TRANSVAGINAL OB US TECHNIQUE: Both transabdominal and transvaginal ultrasound examinations were performed for complete evaluation of the gestation as well as the maternal uterus, adnexal regions, and pelvic cul-de-sac. Transvaginal technique was performed to assess early pregnancy. COMPARISON:  Ultrasound of August 24, 2016. FINDINGS: Intrauterine gestational sac: Not visualized. Yolk sac:  Not visualized. Embryo:  None visualized. Cardiac Activity: Not visualized. Subchorionic hemorrhage:  None visualized. Maternal uterus/adnexae: Left  ovary appears normal. 1.8 cm right paraovarian cyst is noted. Trace free fluid is noted which most likely is physiologic. IMPRESSION: No intrauterine gestational sac, yolk sac, fetal pole, or cardiac activity visualized. Differential considerations include intrauterine gestation too early to be sonographically visualized, spontaneous abortion, or ectopic pregnancy. Consider follow-up ultrasound in 14 days and serial quantitative beta HCG follow-up. Electronically Signed   By: Lupita RaiderJames  Green Jr, M.D.   On: 10/03/2016 07:45    Procedures Procedures (including critical care time)  Medications Ordered in ED Medications - No data to display   Initial Impression / Assessment and Plan / ED Course  I have reviewed the triage vital signs and the nursing notes.  Pertinent labs &  imaging results that were available during my care of the patient were reviewed by me and considered in my medical decision making (see chart for details).     21 yo female with no significant PMH/o who presents with 6 months of intermittent chest pain. Came to the ED today because the pain usually lasts a few seconds but today was approximately a minute long. She denies any association with exertion or deep inspiration. She states the episodes are random and are not associated with any particularly activity. Patient is afebrile, non-toxic appearing, sitting comfortably on examination table. Physical exam shows tenderness to palpation of sternum, otherwise unremarkable. No signs of respiratory distress on exam. Consider muscular strain vs anxiety vs acute infectious etiology. Low suspicion for ACS, though will check troponin and EKG for evaluation. History/exam are not concerning for PE. Patient's Wells Score is 0 and she is therefore low risk for a PE. No further imaging indicated at this time.  Will also obtain basic labs including CBC and CXR. Will given Toradol for pain pending I-state beta hcg results.   CXR reviewed. Negative for any acute infectious etiology. Troponin negative. EKG as documented above. CBC with WBC within normal limits. I-stat beta Hcg positive. Discussed results with patient. Will plan to repeat beta quant to assure true positive. Patient states that her LMP was 08/24/16. She reports that she is currently sexually active with one partner. She does state that she uses condoms but states that a few weeks ago one broke during intercourse. She denies any recent vaginal bleeding or abdominal pain. Offered patient pregnancy safe analgesics but patient declines at this time as she is not having any pain.   Repeat beta quant positive. Discussed results with patient. Will plan to provide outpatient resources that she can follow-up with. Patient still denying any chest pain. Vitals stable at  this time. Patient is stable for discharge at this time. Strict return precautions discussed. Patient expresses understanding and agreement to plan.      Final Clinical Impressions(s) / ED Diagnoses   Final diagnoses:  Chest wall pain    New Prescriptions Discharge Medication List as of 10/01/2016 11:29 PM       Maxwell CaulLayden, Aldina Porta A, PA-C 10/04/16 2303    Nira Connardama, Pedro Eduardo, MD 10/05/16 0028

## 2016-10-01 NOTE — Discharge Instructions (Signed)
You can take tylenol for the pain.   Return to the Emergency Department for any worsening chest pain, difficulty breathing, vaginal bleeding, abdominal pain, lightheadedness, weakness, or any other concerning or worsening symptoms.   [facility]. You can also follow-up with the referred primary care doctors listed in your papers.

## 2016-10-03 ENCOUNTER — Emergency Department (HOSPITAL_COMMUNITY)
Admission: EM | Admit: 2016-10-03 | Discharge: 2016-10-03 | Disposition: A | Discharge status: Home / Self Care | Payer: BLUE CROSS/BLUE SHIELD | Attending: Emergency Medicine | Admitting: Emergency Medicine

## 2016-10-03 ENCOUNTER — Emergency Department (HOSPITAL_COMMUNITY): Payer: BLUE CROSS/BLUE SHIELD

## 2016-10-03 ENCOUNTER — Encounter (HOSPITAL_COMMUNITY): Payer: Self-pay

## 2016-10-03 ENCOUNTER — Emergency Department (HOSPITAL_COMMUNITY)
Admission: EM | Admit: 2016-10-03 | Discharge: 2016-10-03 | Disposition: A | Discharge status: Home / Self Care | Payer: BLUE CROSS/BLUE SHIELD | Source: Home / Self Care | Attending: Emergency Medicine | Admitting: Emergency Medicine

## 2016-10-03 ENCOUNTER — Encounter (HOSPITAL_COMMUNITY): Payer: Self-pay | Admitting: Emergency Medicine

## 2016-10-03 DIAGNOSIS — O209 Hemorrhage in early pregnancy, unspecified: Secondary | ICD-10-CM | POA: Insufficient documentation

## 2016-10-03 DIAGNOSIS — Z79899 Other long term (current) drug therapy: Secondary | ICD-10-CM | POA: Insufficient documentation

## 2016-10-03 DIAGNOSIS — N939 Abnormal uterine and vaginal bleeding, unspecified: Secondary | ICD-10-CM

## 2016-10-03 DIAGNOSIS — O200 Threatened abortion: Secondary | ICD-10-CM | POA: Insufficient documentation

## 2016-10-03 DIAGNOSIS — Z3A01 Less than 8 weeks gestation of pregnancy: Secondary | ICD-10-CM | POA: Insufficient documentation

## 2016-10-03 DIAGNOSIS — O4690 Antepartum hemorrhage, unspecified, unspecified trimester: Secondary | ICD-10-CM

## 2016-10-03 LAB — URINALYSIS, ROUTINE W REFLEX MICROSCOPIC
Bilirubin Urine: NEGATIVE
GLUCOSE, UA: NEGATIVE mg/dL
KETONES UR: NEGATIVE mg/dL
LEUKOCYTES UA: NEGATIVE
Nitrite: NEGATIVE
PH: 6 (ref 5.0–8.0)
Protein, ur: NEGATIVE mg/dL
Specific Gravity, Urine: 1.02 (ref 1.005–1.030)

## 2016-10-03 LAB — I-STAT CHEM 8, ED
BUN: 5 mg/dL — AB (ref 6–20)
CALCIUM ION: 1.14 mmol/L — AB (ref 1.15–1.40)
CREATININE: 0.6 mg/dL (ref 0.44–1.00)
Chloride: 104 mmol/L (ref 101–111)
Glucose, Bld: 85 mg/dL (ref 65–99)
HCT: 37 % (ref 36.0–46.0)
Hemoglobin: 12.6 g/dL (ref 12.0–15.0)
Potassium: 3.6 mmol/L (ref 3.5–5.1)
SODIUM: 139 mmol/L (ref 135–145)
TCO2: 23 mmol/L (ref 0–100)

## 2016-10-03 LAB — WET PREP, GENITAL
Clue Cells Wet Prep HPF POC: NONE SEEN
Sperm: NONE SEEN
Trich, Wet Prep: NONE SEEN
Yeast Wet Prep HPF POC: NONE SEEN

## 2016-10-03 LAB — URINALYSIS, MICROSCOPIC (REFLEX)
BACTERIA UA: NONE SEEN
SQUAMOUS EPITHELIAL / LPF: NONE SEEN
WBC UA: NONE SEEN WBC/hpf (ref 0–5)

## 2016-10-03 LAB — HCG, QUANTITATIVE, PREGNANCY: hCG, Beta Chain, Quant, S: 111 m[IU]/mL — ABNORMAL HIGH (ref <5)

## 2016-10-03 LAB — ABO/RH: ABO/RH(D): O POS

## 2016-10-03 MED ORDER — HYDROCODONE-ACETAMINOPHEN 5-325 MG PO TABS: 1.0000 | ORAL_TABLET | Freq: Four times a day (QID) | ORAL | 0 refills | Status: DC | PRN

## 2016-10-03 NOTE — Discharge Instructions (Signed)
Please reviewed attached information about pelvic rest. Follow-up with OB and women's clinic for further lab work and evaluation in the next 2-3 days. Return to ED for worsening pain, increased bleeding, lightheadedness, trouble breathing, injury.

## 2016-10-03 NOTE — ED Provider Notes (Signed)
[facility]-EMERGENCY DEPT Provider Note   CSN: [id number] Arrival date & time: 10/03/16  0331     History   Chief Complaint Chief Complaint  Patient presents with  . Threatened Miscarriage    HPI Megan Strong is a 21 y.o. [id number] female with a PSHx of therapeutic abortion, who presents to the ED with complaints of scant dark red vaginal bleeding that began around 9 PM last night. Chart review reveals she was seen in the ED on 10/01/16 for chest wall pain for several months; work up included betaHCG which was 56.8, so a quantHCG was done which was 71. She states that on 10/02/16 (night before presentation) at around 9pm, she developed scant dark red vaginal bleeding/spotting with no passage of clots, less than 1 pad worth, and no passage of products of conception. She states this started spontaneously while she was at work. No known aggravating or alleviating factors, no treatments tried. She also reports associated mild intermittent vaginal cramping. Her last pregnancy ended in elective abortion, so she has never had any other pregnancies to compare to. No known prior spontaneous abortions or ectopic pregnancies. LMP 08/24/16. She is sexually active with one female partner, occasionally unprotected. She had intercourse yesterday morning several hours prior to onset of her symptoms. She denies any fevers, chills, CP, SOB, abdominal pain, n/v/d/c, dysuria, hematuria, vaginal discharge, vaginal itching, numbness, tingling, focal weakness, or any other complaints at this time.   The history is provided by the patient and medical records. No language interpreter was used.  Vaginal Bleeding  Primary symptoms include vaginal bleeding.  Primary symptoms include no genital itching, no dysuria. There has been no fever. This is a new problem. The current episode started 6 to 12 hours ago. The problem occurs constantly. The problem has not changed since onset.The symptoms occur spontaneously. She is pregnant. Her LMP  was weeks ago. Pertinent negatives include no abdominal pain, no constipation, no diarrhea, no nausea and no vomiting. She has tried nothing for the symptoms. The treatment provided no relief. Sexual activity: sexually active. Associated medical issues include terminated pregnancy.    History reviewed. No pertinent past medical history.  There are no active problems to display for this patient.   Past Surgical History:  Procedure Laterality Date  . THERAPEUTIC ABORTION      OB History    Gravida Para Term Preterm AB Living   1             SAB TAB Ectopic Multiple Live Births                   Home Medications    Prior to Admission medications   Medication Sig Start Date End Date Taking? Authorizing Provider  CRANBERRY PO Take 1 tablet by mouth every other day.    [provider]  hydrocortisone cream 1 % Apply 1 application topically 2 (two) times daily.    [provider]  ibuprofen (ADVIL,MOTRIN) 200 MG tablet Take 400 mg by mouth every 6 (six) hours as needed for mild pain.    [provider]  ibuprofen (ADVIL,MOTRIN) 800 MG tablet Take 1 tablet (800 mg total) by mouth every 8 (eight) hours as needed for mild pain, moderate pain or cramping. Patient not taking: Reported on 08/24/2016 08/05/16   Lyndal PulleyKnott, Daniel, MD  polyethylene glycol powder (MIRALAX) powder TAKE 6 CAPFULS OF MIRALAX IN A 32 OUNCE GATORADE AND DRINK THE WHOLE BEVERAGE FOLLOWED BY 3 CAPFULS TWICE A DAY FOR THE NEXT  WEEK AND FOLLOW UP WITH YOUR PRIMARY CARE PHYSICIAN. Patient not taking: Reported on 08/24/2016 08/05/16   Lyndal Pulley, MD    Family History Family History  Problem Relation Age of Onset  . Rheum arthritis Mother   . Diabetes Mother   . Rheum arthritis Father     Social History Social History  Substance Use Topics  . Smoking status: Never Smoker  . Smokeless tobacco: Never Used  . Alcohol use No     Allergies   Patient has no known allergies.   Review of  Systems Review of Systems  Constitutional: Negative for chills and fever.  Respiratory: Negative for shortness of breath.   Cardiovascular: Negative for chest pain.  Gastrointestinal: Negative for abdominal pain, constipation, diarrhea, nausea and vomiting.  Genitourinary: Positive for vaginal bleeding and vaginal pain (cramping). Negative for dysuria, hematuria and vaginal discharge.  Musculoskeletal: Negative for arthralgias and myalgias.  Skin: Negative for color change.  Allergic/Immunologic: Negative for immunocompromised state.  Neurological: Negative for weakness and numbness.  Psychiatric/Behavioral: Negative for confusion.   All other systems reviewed and are negative for acute change except as noted in the HPI.    Physical Exam Updated Vital Signs BP 120/84 (BP Location: Right Arm)   Pulse 71   Temp 98.2 F (36.8 C) (Oral)   Resp 18   LMP 08/24/2016 Comment: irregular periods  SpO2 100%   Physical Exam  Constitutional: She is oriented to person, place, and time. Vital signs are normal. She appears well-developed and well-nourished.  Non-toxic appearance. No distress.  Afebrile, nontoxic, NAD  HENT:  Head: Normocephalic and atraumatic.  Mouth/Throat: Oropharynx is clear and moist and mucous membranes are normal.  Eyes: Conjunctivae and EOM are normal. Right eye exhibits no discharge. Left eye exhibits no discharge.  Neck: Normal range of motion. Neck supple.  Cardiovascular: Normal rate, regular rhythm, normal heart sounds and intact distal pulses.  Exam reveals no gallop and no friction rub.   No murmur heard. Pulmonary/Chest: Effort normal and breath sounds normal. No respiratory distress. She has no decreased breath sounds. She has no wheezes. She has no rhonchi. She has no rales.  Abdominal: Soft. Normal appearance and bowel sounds are normal. She exhibits no distension. There is no tenderness. There is no rigidity, no rebound, no guarding, no CVA tenderness, no  tenderness at McBurney's point and negative Murphy's sign.  Soft, NTND, +BS throughout, no r/g/r, neg murphy's, neg mcburney's, no CVA TTP   Musculoskeletal: Normal range of motion.  Neurological: She is alert and oriented to person, place, and time. She has normal strength. No sensory deficit.  Skin: Skin is warm, dry and intact. No rash noted.  Psychiatric: She has a normal mood and affect.  Nursing note and vitals reviewed.    ED Treatments / Results  Labs (all labs ordered are listed, but only abnormal results are displayed) Labs Reviewed  URINALYSIS, ROUTINE W REFLEX MICROSCOPIC - Abnormal; Notable for the following:       Result Value   Hgb urine dipstick LARGE (*)    All other components within normal limits  HCG, QUANTITATIVE, PREGNANCY - Abnormal; Notable for the following:    hCG, Beta Chain, Quant, S 111 (*)    All other components within normal limits  URINALYSIS, MICROSCOPIC (REFLEX)  ABO/RH   Results for orders placed or performed during the hospital encounter of 10/01/16  hCG, quantitative, pregnancy  Result Value Ref Range   hCG, Beta Chain, Quant, S 71 (H) <5  mIU/mL  I-Stat Beta hCG blood, ED ([facility], [facility], [facility] only)  Result Value Ref Range   I-stat hCG, quantitative 56.8 (H) <5 mIU/mL   Comment 3            EKG  EKG Interpretation None       Radiology Dg Chest 2 View  Result Date: 10/01/2016 CLINICAL DATA:  Chest pain EXAM: CHEST  2 VIEW COMPARISON:  Chest radiograph 06/24/2015 FINDINGS: The heart size and mediastinal contours are within normal limits. Both lungs are clear. The visualized skeletal structures are unremarkable. IMPRESSION: No active cardiopulmonary disease. Electronically Signed   By: Deatra Robinson M.D.   On: 10/01/2016 20:52    Procedures Procedures (including critical care time)  Medications Ordered in ED Medications - No data to display   Initial Impression / Assessment and Plan / ED Course  I have reviewed the triage vital signs and  the nursing notes.  Pertinent labs & imaging results that were available during my care of the patient were reviewed by me and considered in my medical decision making (see chart for details).     21 y.o. [id number] female here with scant vaginal bleeding that started around 9pm last night; just recently found out she's pregnant because she was seen at the ED 10/01/16 for 6 months of CP, had betaHCG that was 56.8, quantHCG done and was 71. LMP 08/24/16 so EGA [redacted]w[redacted]d based on LMP. Reports some vaginal cramping but no abdominal pain. On exam, no abdominal tenderness. Given low quant, doubt pelvic exam would be helpful; will get U/S to see if this is ectopic vs IUP, and see if there are any signs of threatened Ab if possible (given low quant, doubt we'll see much on U/S anyway). Will get U/A, ABO/Rh, and repeat quantHCG. Will reassess shortly  7:17 AM U/A with large hgb but otherwise unremarkable, no evidence of infection. QuantHCG 111 which is not the appropriate rise that we would expect in this time frame, however is still rising slightly. U/S being done currently. Patient care to be resumed by Dietrich Pates, PA-C at shift change sign-out. Patient history has been discussed with midlevel resuming care. Please see their notes for further documentation of pending results and dispo/care. Pt stable at sign-out and updated on transfer of care.   Final Clinical Impressions(s) / ED Diagnoses   Final diagnoses:  Vaginal bleeding  Vaginal bleeding in pregnancy  Threatened miscarriage in early pregnancy    New Prescriptions New Prescriptions   No medications on [address], [place], [place] 10/03/16 4742    Palumbo, April, MD 10/03/16 [phone]

## 2016-10-03 NOTE — ED Triage Notes (Signed)
Pt was seen here earlier today for vaginal bleeding and was instructed to come back if she began to have increased pain and bleeding. Pt states she has been through one pad since then.  Denies N&V or diarrhea.

## 2016-10-03 NOTE — ED Triage Notes (Signed)
Pt presents with c/o threatened miscarriage. Pt reports she was seen recently for another complaint and was told that she was pregnant. Pt reports that around 9 pm last night (5/9), she started having some bleeding and spotting that became progressively darker, denies seeing any clots. Pt reports she is not sure how far along she is.

## 2016-10-03 NOTE — ED Provider Notes (Signed)
[facility]-EMERGENCY DEPT Provider Note   CSN: [id number] Arrival date & time: 10/03/16  1738     History   Chief Complaint Chief Complaint  Patient presents with  . Vaginal Bleeding    HPI Megan Strong is a 21 y.o. female.   Vaginal Bleeding  Primary symptoms include pelvic pain (cramping), vaginal bleeding. The fever has been present for 1 to 2 days. This is a new problem. The current episode started yesterday. The problem occurs constantly. The problem has been gradually worsening. Her LMP was weeks ago. The patient's menstrual history has been regular.    History reviewed. No pertinent past medical history.  There are no active problems to display for this patient.   Past Surgical History:  Procedure Laterality Date  . THERAPEUTIC ABORTION      OB History    Gravida Para Term Preterm AB Living   1             SAB TAB Ectopic Multiple Live Births                   Home Medications    Prior to Admission medications   Medication Sig Start Date End Date Taking? Authorizing Provider  CRANBERRY PO Take 1 tablet by mouth every other day.   Yes [provider]  HYDROcodone-acetaminophen (NORCO/VICODIN) 5-325 MG tablet Take 1-2 tablets by mouth every 6 (six) hours as needed for severe pain. 10/03/16   Aqua Denslow, Barbara Cower, MD    Family History Family History  Problem Relation Age of Onset  . Rheum arthritis Mother   . Diabetes Mother   . Rheum arthritis Father     Social History Social History  Substance Use Topics  . Smoking status: Never Smoker  . Smokeless tobacco: Never Used  . Alcohol use No     Allergies   Patient has no known allergies.   Review of Systems Review of Systems  Genitourinary: Positive for pelvic pain (cramping) and vaginal bleeding.  All other systems reviewed and are negative.    Physical Exam Updated Vital Signs BP 118/73 (BP Location: Right Arm)   Pulse 78   Temp 98.5 F (36.9 C) (Oral)   Resp 16   Wt 111 lb (50.3  kg)   LMP 08/24/2016 Comment: irregular periods  SpO2 100%   BMI 18.19 kg/m   Physical Exam  Constitutional: She is oriented to person, place, and time. She appears well-developed and well-nourished.  HENT:  Head: Normocephalic and atraumatic.  Eyes: Conjunctivae and EOM are normal.  Neck: Normal range of motion.  Cardiovascular: Normal rate and regular rhythm.   Pulmonary/Chest: Effort normal and breath sounds normal. No stridor. No respiratory distress.  Abdominal: Soft. She exhibits no distension.  Genitourinary: There is no tenderness on the right labia. There is no tenderness on the left labia. Right adnexum displays no tenderness and no fullness. Left adnexum displays no tenderness and no fullness. There is bleeding in the vagina.  Musculoskeletal: Normal range of motion. She exhibits no edema or deformity.  Neurological: She is alert and oriented to person, place, and time. No cranial nerve deficit. Coordination normal.  Skin: Skin is warm and dry.  Nursing note and vitals reviewed.    ED Treatments / Results  Labs (all labs ordered are listed, but only abnormal results are displayed) Labs Reviewed  WET PREP, GENITAL - Abnormal; Notable for the following:       Result Value   WBC, Wet Prep HPF POC FEW (*)  All other components within normal limits  I-STAT CHEM 8, ED - Abnormal; Notable for the following:    BUN 5 (*)    Calcium, Ion 1.14 (*)    All other components within normal limits  RPR  HIV ANTIBODY (ROUTINE TESTING)  GC/CHLAMYDIA PROBE AMP ([place]    EKG  EKG Interpretation None       Radiology [place] Ob Comp Less 14 Wks  Result Date: 10/03/2016 CLINICAL DATA:  Vaginal bleeding, first trimester of pregnancy. EXAM: OBSTETRIC <14 WK US AND TRANSVAGINAL OB US TECHNIQUE: Both transabdominal and transvaginal ultrasound examinations were performed for complete evaluation of the gestation as well as the maternal uterus, adnexal regions, and  pelvic cul-de-sac. Transvaginal technique was performed to assess early pregnancy. COMPARISON:  Ultrasound of August 24, 2016. FINDINGS: Intrauterine gestational sac: Not visualized. Yolk sac:  Not visualized. Embryo:  None visualized. Cardiac Activity: Not visualized. Subchorionic hemorrhage:  None visualized. Maternal uterus/adnexae: Left ovary appears normal. 1.8 cm right paraovarian cyst is noted. Trace free fluid is noted which most likely is physiologic. IMPRESSION: No intrauterine gestational sac, yolk sac, fetal pole, or cardiac activity visualized. Differential considerations include intrauterine gestation too early to be sonographically visualized, spontaneous abortion, or ectopic pregnancy. Consider follow-up ultrasound in 14 days and serial quantitative beta HCG follow-up. Electronically Signed   By: Lupita RaiderJames  Green Jr, M.D.   On: 10/03/2016 07:45   [place] Ob Transvaginal  Result Date: 10/03/2016 CLINICAL DATA:  Vaginal bleeding, first trimester of pregnancy. EXAM: OBSTETRIC <14 WK US AND TRANSVAGINAL OB US TECHNIQUE: Both transabdominal and transvaginal ultrasound examinations were performed for complete evaluation of the gestation as well as the maternal uterus, adnexal regions, and pelvic cul-de-sac. Transvaginal technique was performed to assess early pregnancy. COMPARISON:  Ultrasound of August 24, 2016. FINDINGS: Intrauterine gestational sac: Not visualized. Yolk sac:  Not visualized. Embryo:  None visualized. Cardiac Activity: Not visualized. Subchorionic hemorrhage:  None visualized. Maternal uterus/adnexae: Left ovary appears normal. 1.8 cm right paraovarian cyst is noted. Trace free fluid is noted which most likely is physiologic. IMPRESSION: No intrauterine gestational sac, yolk sac, fetal pole, or cardiac activity visualized. Differential considerations include intrauterine gestation too early to be sonographically visualized, spontaneous abortion, or ectopic pregnancy. Consider follow-up  ultrasound in 14 days and serial quantitative beta HCG follow-up. Electronically Signed   By: Lupita RaiderJames  Green Jr, M.D.   On: 10/03/2016 07:45    Procedures Procedures (including critical care time)  Medications Ordered in ED Medications - No data to display   Initial Impression / Assessment and Plan / ED Course  I have reviewed the triage vital signs and the nursing notes.  Pertinent labs & imaging results that were available during my care of the patient were reviewed by me and considered in my medical decision making (see chart for details).     US done this AM without obvious pregnancy. hcg barely elevated. Only using one pad every  Couple hours. crampiness as well and thus came back for eval.  Exam unremarkable. Workup appropriate. Plan for dc with close follow up at [facility] for likely incomplete vs threatened miscarriage.   Final Clinical Impressions(s) / ED Diagnoses   Final diagnoses:  Vaginal bleeding    New Prescriptions New Prescriptions   HYDROCODONE-ACETAMINOPHEN (NORCO/VICODIN) 5-325 MG TABLET    Take 1-2 tablets by mouth every 6 (six) hours as needed for severe pain.     Chaunice Obie, Barbara CowerJason, MD 10/03/16 [phone]

## 2016-10-03 NOTE — ED Provider Notes (Signed)
  Physical Exam  BP 107/68   Pulse 68   Temp 98.2 F (36.8 C) (Oral)   Resp 18   LMP 08/24/2016 Comment: irregular periods  SpO2 99%   Physical Exam  Constitutional: She appears well-developed and well-nourished. No distress.  HENT:  Head: Normocephalic and atraumatic.  Eyes: Conjunctivae and EOM are normal. No scleral icterus.  Neck: Normal range of motion.  Cardiovascular: Normal rate and regular rhythm.   Pulmonary/Chest: Effort normal and breath sounds normal. No respiratory distress.  Neurological: She is alert.  Skin: No rash noted. She is not diaphoretic.  Psychiatric: She has a normal mood and affect.  Nursing note and vitals reviewed.   ED Course  Procedures  [facility]M [facility] PA-C at shift change pending ultrasound results. Ultrasound revealed no evidence of pregnancy at this time. Patient was advised to follow instructions for pelvic rest and follow-up with OB or women's clinic for further evaluation and monitoring lab work. Strict return precautions given to patient.       Dietrich PatesKhatri, Ruhaan Nordahl, PA-C 10/03/16 [id number]    Arby BarrettePfeiffer, Marcy, MD 10/04/16 [phone]

## 2016-10-04 LAB — GC/CHLAMYDIA PROBE AMP (~~LOC~~) NOT AT ARMC
CHLAMYDIA, DNA PROBE: NEGATIVE
Neisseria Gonorrhea: NEGATIVE

## 2016-10-04 LAB — HIV ANTIBODY (ROUTINE TESTING W REFLEX): HIV Screen 4th Generation wRfx: NONREACTIVE

## 2016-10-04 LAB — RPR: RPR: NONREACTIVE

## 2016-11-04 ENCOUNTER — Encounter: Payer: BLUE CROSS/BLUE SHIELD | Admitting: Obstetrics and Gynecology

## 2017-01-24 ENCOUNTER — Encounter: Payer: Self-pay | Admitting: *Deleted

## 2017-01-24 LAB — PREGNANCY, URINE: PREG TEST UR: POSITIVE

## 2017-02-05 ENCOUNTER — Encounter: Payer: BLUE CROSS/BLUE SHIELD | Admitting: Obstetrics and Gynecology

## 2017-02-07 ENCOUNTER — Ambulatory Visit (INDEPENDENT_AMBULATORY_CARE_PROVIDER_SITE_OTHER): Payer: Self-pay | Admitting: Obstetrics and Gynecology

## 2017-02-07 ENCOUNTER — Encounter: Payer: Self-pay | Admitting: Obstetrics and Gynecology

## 2017-02-07 ENCOUNTER — Other Ambulatory Visit (HOSPITAL_COMMUNITY)
Admission: RE | Admit: 2017-02-07 | Discharge: 2017-02-07 | Disposition: A | Discharge status: Home / Self Care | Payer: Medicaid Other | Source: Ambulatory Visit | Attending: Obstetrics and Gynecology | Admitting: Obstetrics and Gynecology

## 2017-02-07 VITALS — BP 111/67 | HR 76 | Wt 113.5 lb

## 2017-02-07 DIAGNOSIS — Z3482 Encounter for supervision of other normal pregnancy, second trimester: Secondary | ICD-10-CM | POA: Insufficient documentation

## 2017-02-07 DIAGNOSIS — Z3400 Encounter for supervision of normal first pregnancy, unspecified trimester: Secondary | ICD-10-CM

## 2017-02-07 DIAGNOSIS — Z3493 Encounter for supervision of normal pregnancy, unspecified, third trimester: Secondary | ICD-10-CM | POA: Insufficient documentation

## 2017-02-07 DIAGNOSIS — Z3402 Encounter for supervision of normal first pregnancy, second trimester: Secondary | ICD-10-CM

## 2017-02-07 DIAGNOSIS — Z3A14 14 weeks gestation of pregnancy: Secondary | ICD-10-CM | POA: Insufficient documentation

## 2017-02-07 DIAGNOSIS — Z3490 Encounter for supervision of normal pregnancy, unspecified, unspecified trimester: Secondary | ICD-10-CM

## 2017-02-07 DIAGNOSIS — Z113 Encounter for screening for infections with a predominantly sexual mode of transmission: Secondary | ICD-10-CM

## 2017-02-07 DIAGNOSIS — Z3480 Encounter for supervision of other normal pregnancy, unspecified trimester: Secondary | ICD-10-CM

## 2017-02-07 DIAGNOSIS — Z331 Pregnant state, incidental: Secondary | ICD-10-CM

## 2017-02-07 DIAGNOSIS — Z124 Encounter for screening for malignant neoplasm of cervix: Secondary | ICD-10-CM

## 2017-02-07 NOTE — Patient Instructions (Signed)
 Second Trimester of Pregnancy The second trimester is from week 14 through week 27 (months 4 through 6). The second trimester is often a time when you feel your best. Your body has adjusted to being pregnant, and you begin to feel better physically. Usually, morning sickness has lessened or quit completely, you may have more energy, and you may have an increase in appetite. The second trimester is also a time when the fetus is growing rapidly. At the end of the sixth month, the fetus is about 9 inches long and weighs about 1 pounds. You will likely begin to feel the baby move (quickening) between 16 and 20 weeks of pregnancy. Body changes during your second trimester Your body continues to go through many changes during your second trimester. The changes vary from woman to woman.  Your weight will continue to increase. You will notice your lower abdomen bulging out.  You may begin to get stretch marks on your hips, abdomen, and breasts.  You may develop headaches that can be relieved by medicines. The medicines should be approved by your health care provider.  You may urinate more often because the fetus is pressing on your bladder.  You may develop or continue to have heartburn as a result of your pregnancy.  You may develop constipation because certain hormones are causing the muscles that push waste through your intestines to slow down.  You may develop hemorrhoids or swollen, bulging veins (varicose veins).  You may have back pain. This is caused by: ? Weight gain. ? Pregnancy hormones that are relaxing the joints in your pelvis. ? A shift in weight and the muscles that support your balance.  Your breasts will continue to grow and they will continue to become tender.  Your gums may bleed and may be sensitive to brushing and flossing.  Dark spots or blotches (chloasma, mask of pregnancy) may develop on your face. This will likely fade after the baby is born.  A dark line from  your belly button to the pubic area (linea nigra) may appear. This will likely fade after the baby is born.  You may have changes in your hair. These can include thickening of your hair, rapid growth, and changes in texture. Some women also have hair loss during or after pregnancy, or hair that feels dry or thin. Your hair will most likely return to normal after your baby is born.  What to expect at prenatal visits During a routine prenatal visit:  You will be weighed to make sure you and the fetus are growing normally.  Your blood pressure will be taken.  Your abdomen will be measured to track your baby's growth.  The fetal heartbeat will be listened to.  Any test results from the previous visit will be discussed.  Your health care provider may ask you:  How you are feeling.  If you are feeling the baby move.  If you have had any abnormal symptoms, such as leaking fluid, bleeding, severe headaches, or abdominal cramping.  If you are using any tobacco products, including cigarettes, chewing tobacco, and electronic cigarettes.  If you have any questions.  Other tests that may be performed during your second trimester include:  Blood tests that check for: ? Low iron levels (anemia). ? High blood sugar that affects pregnant women (gestational diabetes) between 24 and 28 weeks. ? Rh antibodies. This is to check for a protein on red blood cells (Rh factor).  Urine tests to check for infections, diabetes,   or protein in the urine.  An ultrasound to confirm the proper growth and development of the baby.  An amniocentesis to check for possible genetic problems.  Fetal screens for spina bifida and Down syndrome.  HIV (human immunodeficiency virus) testing. Routine prenatal testing includes screening for HIV, unless you choose not to have this test.  Follow these instructions at home: Medicines  Follow your health care provider's instructions regarding medicine use. Specific  medicines may be either safe or unsafe to take during pregnancy.  Take a prenatal vitamin that contains at least 600 micrograms (mcg) of folic acid.  If you develop constipation, try taking a stool softener if your health care provider approves. Eating and drinking  Eat a balanced diet that includes fresh fruits and vegetables, whole grains, good sources of protein such as meat, eggs, or tofu, and low-fat dairy. Your health care provider will help you determine the amount of weight gain that is right for you.  Avoid raw meat and uncooked cheese. These carry germs that can cause birth defects in the baby.  If you have low calcium intake from food, talk to your health care provider about whether you should take a daily calcium supplement.  Limit foods that are high in fat and processed sugars, such as fried and sweet foods.  To prevent constipation: ? Drink enough fluid to keep your urine clear or pale yellow. ? Eat foods that are high in fiber, such as fresh fruits and vegetables, whole grains, and beans. Activity  Exercise only as directed by your health care provider. Most women can continue their usual exercise routine during pregnancy. Try to exercise for 30 minutes at least 5 days a week. Stop exercising if you experience uterine contractions.  Avoid heavy lifting, wear low heel shoes, and practice good posture.  A sexual relationship may be continued unless your health care provider directs you otherwise. Relieving pain and discomfort  Wear a good support bra to prevent discomfort from breast tenderness.  Take warm sitz baths to soothe any pain or discomfort caused by hemorrhoids. Use hemorrhoid cream if your health care provider approves.  Rest with your legs elevated if you have leg cramps or low back pain.  If you develop varicose veins, wear support hose. Elevate your feet for 15 minutes, 3-4 times a day. Limit salt in your diet. Prenatal Care  Write down your questions.  Take them to your prenatal visits.  Keep all your prenatal visits as told by your health care provider. This is important. Safety  Wear your seat belt at all times when driving.  Make a list of emergency phone numbers, including numbers for family, friends, the hospital, and police and fire departments. General instructions  Ask your health care provider for a referral to a local prenatal education class. Begin classes no later than the beginning of month 6 of your pregnancy.  Ask for help if you have counseling or nutritional needs during pregnancy. Your health care provider can offer advice or refer you to specialists for help with various needs.  Do not use hot tubs, steam rooms, or saunas.  Do not douche or use tampons or scented sanitary pads.  Do not cross your legs for long periods of time.  Avoid cat litter boxes and soil used by cats. These carry germs that can cause birth defects in the baby and possibly loss of the fetus by miscarriage or stillbirth.  Avoid all smoking, herbs, alcohol, and unprescribed drugs. Chemicals in these products   can affect the formation and growth of the baby.  Do not use any products that contain nicotine or tobacco, such as cigarettes and e-cigarettes. If you need help quitting, ask your health care provider.  Visit your dentist if you have not gone yet during your pregnancy. Use a soft toothbrush to brush your teeth and be gentle when you floss. Contact a health care provider if:  You have dizziness.  You have mild pelvic cramps, pelvic pressure, or nagging pain in the abdominal area.  You have persistent nausea, vomiting, or diarrhea.  You have a bad smelling vaginal discharge.  You have pain when you urinate. Get help right away if:  You have a fever.  You are leaking fluid from your vagina.  You have spotting or bleeding from your vagina.  You have severe abdominal cramping or pain.  You have rapid weight gain or weight  loss.  You have shortness of breath with chest pain.  You notice sudden or extreme swelling of your face, hands, ankles, feet, or legs.  You have not felt your baby move in over an hour.  You have severe headaches that do not go away when you take medicine.  You have vision changes. Summary  The second trimester is from week 14 through week 27 (months 4 through 6). It is also a time when the fetus is growing rapidly.  Your body goes through many changes during pregnancy. The changes vary from woman to woman.  Avoid all smoking, herbs, alcohol, and unprescribed drugs. These chemicals affect the formation and growth your baby.  Do not use any tobacco products, such as cigarettes, chewing tobacco, and e-cigarettes. If you need help quitting, ask your health care provider.  Contact your health care provider if you have any questions. Keep all prenatal visits as told by your health care provider. This is important. This information is not intended to replace advice given to you by your health care provider. Make sure you discuss any questions you have with your health care provider. Document Released: 05/07/2001 Document Revised: 10/19/2015 Document Reviewed: 07/14/2012 [employer] Interactive Patient Education  2017 [employer]  Contraception Choices Contraception (birth control) is the use of any methods or devices to prevent pregnancy. Below are some methods to help avoid pregnancy. Hormonal methods  Contraceptive implant. This is a thin, plastic tube containing progesterone hormone. It does not contain estrogen hormone. Your health care provider inserts the tube in the inner part of the upper arm. The tube can remain in place for up to 3 years. After 3 years, the implant must be removed. The implant prevents the ovaries from releasing an egg (ovulation), thickens the cervical mucus to prevent sperm from entering the uterus, and thins the lining of the inside of the  uterus.  Progesterone-only injections. These injections are given every 3 months by your health care provider to prevent pregnancy. This synthetic progesterone hormone stops the ovaries from releasing eggs. It also thickens cervical mucus and changes the uterine lining. This makes it harder for sperm to survive in the uterus.  Birth control pills. These pills contain estrogen and progesterone hormone. They work by preventing the ovaries from releasing eggs (ovulation). They also cause the cervical mucus to thicken, preventing the sperm from entering the uterus. Birth control pills are prescribed by a health care provider.Birth control pills can also be used to treat heavy periods.  Minipill. This type of birth control pill contains only the progesterone hormone. They are taken every day   of each month and must be prescribed by your health care provider.  Birth control patch. The patch contains hormones similar to those in birth control pills. It must be changed once a week and is prescribed by a health care provider.  Vaginal ring. The ring contains hormones similar to those in birth control pills. It is left in the vagina for 3 weeks, removed for 1 week, and then a new one is put back in place. The patient must be comfortable inserting and removing the ring from the vagina.A health care provider's prescription is necessary.  Emergency contraception. Emergency contraceptives prevent pregnancy after unprotected sexual intercourse. This pill can be taken right after sex or up to 5 days after unprotected sex. It is most effective the sooner you take the pills after having sexual intercourse. Most emergency contraceptive pills are available without a prescription. Check with your pharmacist. Do not use emergency contraception as your only form of birth control. Barrier methods  Female condom. This is a thin sheath (latex or rubber) that is worn over the penis during sexual intercourse. It can be used with  spermicide to increase effectiveness.  Female condom. This is a soft, loose-fitting sheath that is put into the vagina before sexual intercourse.  Diaphragm. This is a soft, latex, dome-shaped barrier that must be fitted by a health care provider. It is inserted into the vagina, along with a spermicidal jelly. It is inserted before intercourse. The diaphragm should be left in the vagina for 6 to 8 hours after intercourse.  Cervical cap. This is a round, soft, latex or plastic cup that fits over the cervix and must be fitted by a health care provider. The cap can be left in place for up to 48 hours after intercourse.  Sponge. This is a soft, circular piece of polyurethane foam. The sponge has spermicide in it. It is inserted into the vagina after wetting it and before sexual intercourse.  Spermicides. These are chemicals that kill or block sperm from entering the cervix and uterus. They come in the form of creams, jellies, suppositories, foam, or tablets. They do not require a prescription. They are inserted into the vagina with an applicator before having sexual intercourse. The process must be repeated every time you have sexual intercourse. Intrauterine contraception  Intrauterine device (IUD). This is a T-shaped device that is put in a woman's uterus during a menstrual period to prevent pregnancy. There are 2 types: ? Copper IUD. This type of IUD is wrapped in copper wire and is placed inside the uterus. Copper makes the uterus and fallopian tubes produce a fluid that kills sperm. It can stay in place for 10 years. ? Hormone IUD. This type of IUD contains the hormone progestin (synthetic progesterone). The hormone thickens the cervical mucus and prevents sperm from entering the uterus, and it also thins the uterine lining to prevent implantation of a fertilized egg. The hormone can weaken or kill the sperm that get into the uterus. It can stay in place for 3-5 years, depending on which type of IUD  is used. Permanent methods of contraception  Female tubal ligation. This is when the woman's fallopian tubes are surgically sealed, tied, or blocked to prevent the egg from traveling to the uterus.  Hysteroscopic sterilization. This involves placing a small coil or insert into each fallopian tube. Your doctor uses a technique called hysteroscopy to do the procedure. The device causes scar tissue to form. This results in permanent blockage of the fallopian   tubes, so the sperm cannot fertilize the egg. It takes about 3 months after the procedure for the tubes to become blocked. You must use another form of birth control for these 3 months.  Female sterilization. This is when the female has the tubes that carry sperm tied off (vasectomy).This blocks sperm from entering the vagina during sexual intercourse. After the procedure, the man can still ejaculate fluid (semen). Natural planning methods  Natural family planning. This is not having sexual intercourse or using a barrier method (condom, diaphragm, cervical cap) on days the woman could become pregnant.  Calendar method. This is keeping track of the length of each menstrual cycle and identifying when you are fertile.  Ovulation method. This is avoiding sexual intercourse during ovulation.  Symptothermal method. This is avoiding sexual intercourse during ovulation, using a thermometer and ovulation symptoms.  Post-ovulation method. This is timing sexual intercourse after you have ovulated. Regardless of which type or method of contraception you choose, it is important that you use condoms to protect against the transmission of sexually transmitted infections (STIs). Talk with your health care provider about which form of contraception is most appropriate for you. This information is not intended to replace advice given to you by your health care provider. Make sure you discuss any questions you have with your health care provider. Document Released:  05/13/2005 Document Revised: 10/19/2015 Document Reviewed: 11/05/2012 [employer] Interactive Patient Education  2017 [employer]   Breastfeeding Deciding to breastfeed is one of the best choices you can make for you and your baby. A change in hormones during pregnancy causes your breast tissue to grow and increases the number and size of your milk ducts. These hormones also allow proteins, sugars, and fats from your blood supply to make breast milk in your milk-producing glands. Hormones prevent breast milk from being released before your baby is born as well as prompt milk flow after birth. Once breastfeeding has begun, thoughts of your baby, as well as his or her sucking or crying, can stimulate the release of milk from your milk-producing glands. Benefits of breastfeeding For Your Baby  Your first milk (colostrum) helps your baby's digestive system function better.  There are antibodies in your milk that help your baby fight off infections.  Your baby has a lower incidence of asthma, allergies, and sudden infant death syndrome.  The nutrients in breast milk are better for your baby than infant formulas and are designed uniquely for your baby's needs.  Breast milk improves your baby's brain development.  Your baby is less likely to develop other conditions, such as childhood obesity, asthma, or type 2 diabetes mellitus.  For You  Breastfeeding helps to create a very special bond between you and your baby.  Breastfeeding is convenient. Breast milk is always available at the correct temperature and costs nothing.  Breastfeeding helps to burn calories and helps you lose the weight gained during pregnancy.  Breastfeeding makes your uterus contract to its prepregnancy size faster and slows bleeding (lochia) after you give birth.  Breastfeeding helps to lower your risk of developing type 2 diabetes mellitus, osteoporosis, and breast or ovarian cancer later in life.  Signs that your baby  is hungry Early Signs of Hunger  Increased alertness or activity.  Stretching.  Movement of the head from side to side.  Movement of the head and opening of the mouth when the corner of the mouth or cheek is stroked (rooting).  Increased sucking sounds, smacking lips, cooing, sighing, or   squeaking.  Hand-to-mouth movements.  Increased sucking of fingers or hands.  Late Signs of Hunger  Fussing.  Intermittent crying.  Extreme Signs of Hunger Signs of extreme hunger will require calming and consoling before your baby will be able to breastfeed successfully. Do not wait for the following signs of extreme hunger to occur before you initiate breastfeeding:  Restlessness.  A loud, strong cry.  Screaming.  Breastfeeding basics Breastfeeding Initiation  Find a comfortable place to sit or lie down, with your neck and back well supported.  Place a pillow or rolled up blanket under your baby to bring him or her to the level of your breast (if you are seated). Nursing pillows are specially designed to help support your arms and your baby while you breastfeed.  Make sure that your baby's abdomen is facing your abdomen.  Gently massage your breast. With your fingertips, massage from your chest wall toward your nipple in a circular motion. This encourages milk flow. You may need to continue this action during the feeding if your milk flows slowly.  Support your breast with 4 fingers underneath and your thumb above your nipple. Make sure your fingers are well away from your nipple and your baby's mouth.  Stroke your baby's lips gently with your finger or nipple.  When your baby's mouth is open wide enough, quickly bring your baby to your breast, placing your entire nipple and as much of the colored area around your nipple (areola) as possible into your baby's mouth. ? More areola should be visible above your baby's upper lip than below the lower lip. ? Your baby's tongue should be  between his or her lower gum and your breast.  Ensure that your baby's mouth is correctly positioned around your nipple (latched). Your baby's lips should create a seal on your breast and be turned out (everted).  It is common for your baby to suck about 2-3 minutes in order to start the flow of breast milk.  Latching Teaching your baby how to latch on to your breast properly is very important. An improper latch can cause nipple pain and decreased milk supply for you and poor weight gain in your baby. Also, if your baby is not latched onto your nipple properly, he or she may swallow some air during feeding. This can make your baby fussy. Burping your baby when you switch breasts during the feeding can help to get rid of the air. However, teaching your baby to latch on properly is still the best way to prevent fussiness from swallowing air while breastfeeding. Signs that your baby has successfully latched on to your nipple:  Silent tugging or silent sucking, without causing you pain.  Swallowing heard between every 3-4 sucks.  Muscle movement above and in front of his or her ears while sucking.  Signs that your baby has not successfully latched on to nipple:  Sucking sounds or smacking sounds from your baby while breastfeeding.  Nipple pain.  If you think your baby has not latched on correctly, slip your finger into the corner of your baby's mouth to break the suction and place it between your baby's gums. Attempt breastfeeding initiation again. Signs of Successful Breastfeeding Signs from your baby:  A gradual decrease in the number of sucks or complete cessation of sucking.  Falling asleep.  Relaxation of his or her body.  Retention of a small amount of milk in his or her mouth.  Letting go of your breast by himself or herself.    Signs from you:  Breasts that have increased in firmness, weight, and size 1-3 hours after feeding.  Breasts that are softer immediately after  breastfeeding.  Increased milk volume, as well as a change in milk consistency and color by the fifth day of breastfeeding.  Nipples that are not sore, cracked, or bleeding.  Signs That Your Baby is Getting Enough Milk  Wetting at least 1-2 diapers during the first 24 hours after birth.  Wetting at least 5-6 diapers every 24 hours for the first week after birth. The urine should be clear or pale yellow by 5 days after birth.  Wetting 6-8 diapers every 24 hours as your baby continues to grow and develop.  At least 3 stools in a 24-hour period by age 5 days. The stool should be soft and yellow.  At least 3 stools in a 24-hour period by age 7 days. The stool should be seedy and yellow.  No loss of weight greater than 10% of birth weight during the first 3 days of age.  Average weight gain of 4-7 ounces (113-198 g) per week after age 4 days.  Consistent daily weight gain by age 5 days, without weight loss after the age of 2 weeks.  After a feeding, your baby may spit up a small amount. This is common. Breastfeeding frequency and duration Frequent feeding will help you make more milk and can prevent sore nipples and breast engorgement. Breastfeed when you feel the need to reduce the fullness of your breasts or when your baby shows signs of hunger. This is called "breastfeeding on demand." Avoid introducing a pacifier to your baby while you are working to establish breastfeeding (the first 4-6 weeks after your baby is born). After this time you may choose to use a pacifier. Research has shown that pacifier use during the first year of a baby's life decreases the risk of sudden infant death syndrome (SIDS). Allow your baby to feed on each breast as long as he or she wants. Breastfeed until your baby is finished feeding. When your baby unlatches or falls asleep while feeding from the first breast, offer the second breast. Because newborns are often sleepy in the first few weeks of life, you may  need to awaken your baby to get him or her to feed. Breastfeeding times will vary from baby to baby. However, the following rules can serve as a guide to help you ensure that your baby is properly fed:  Newborns (babies 4 weeks of age or younger) may breastfeed every 1-3 hours.  Newborns should not go longer than 3 hours during the day or 5 hours during the night without breastfeeding.  You should breastfeed your baby a minimum of 8 times in a 24-hour period until you begin to introduce solid foods to your baby at around 6 months of age.  Breast milk pumping Pumping and storing breast milk allows you to ensure that your baby is exclusively fed your breast milk, even at times when you are unable to breastfeed. This is especially important if you are going back to work while you are still breastfeeding or when you are not able to be present during feedings. Your lactation consultant can give you guidelines on how long it is safe to store breast milk. A breast pump is a machine that allows you to pump milk from your breast into a sterile bottle. The pumped breast milk can then be stored in a refrigerator or freezer. Some breast pumps are operated by   hand, while others use electricity. Ask your lactation consultant which type will work best for you. Breast pumps can be purchased, but some hospitals and breastfeeding support groups lease breast pumps on a monthly basis. A lactation consultant can teach you how to hand express breast milk, if you prefer not to use a pump. Caring for your breasts while you breastfeed Nipples can become dry, cracked, and sore while breastfeeding. The following recommendations can help keep your breasts moisturized and healthy:  Avoid using soap on your nipples.  Wear a supportive bra. Although not required, special nursing bras and tank tops are designed to allow access to your breasts for breastfeeding without taking off your entire bra or top. Avoid wearing  underwire-style bras or extremely tight bras.  Air dry your nipples for 3-4minutes after each feeding.  Use only cotton bra pads to absorb leaked breast milk. Leaking of breast milk between feedings is normal.  Use lanolin on your nipples after breastfeeding. Lanolin helps to maintain your skin's normal moisture barrier. If you use pure lanolin, you do not need to wash it off before feeding your baby again. Pure lanolin is not toxic to your baby. You may also hand express a few drops of breast milk and gently massage that milk into your nipples and allow the milk to air dry.  In the first few weeks after giving birth, some women experience extremely full breasts (engorgement). Engorgement can make your breasts feel heavy, warm, and tender to the touch. Engorgement peaks within 3-5 days after you give birth. The following recommendations can help ease engorgement:  Completely empty your breasts while breastfeeding or pumping. You may want to start by applying warm, moist heat (in the shower or with warm water-soaked hand towels) just before feeding or pumping. This increases circulation and helps the milk flow. If your baby does not completely empty your breasts while breastfeeding, pump any extra milk after he or she is finished.  Wear a snug bra (nursing or regular) or tank top for 1-2 days to signal your body to slightly decrease milk production.  Apply ice packs to your breasts, unless this is too uncomfortable for you.  Make sure that your baby is latched on and positioned properly while breastfeeding.  If engorgement persists after 48 hours of following these recommendations, contact your health care provider or a lactation consultant. Overall health care recommendations while breastfeeding  Eat healthy foods. Alternate between meals and snacks, eating 3 of each per day. Because what you eat affects your breast milk, some of the foods may make your baby more irritable than usual. Avoid  eating these foods if you are sure that they are negatively affecting your baby.  Drink milk, fruit juice, and water to satisfy your thirst (about 10 glasses a day).  Rest often, relax, and continue to take your prenatal vitamins to prevent fatigue, stress, and anemia.  Continue breast self-awareness checks.  Avoid chewing and smoking tobacco. Chemicals from cigarettes that pass into breast milk and exposure to secondhand smoke may harm your baby.  Avoid alcohol and drug use, including marijuana. Some medicines that may be harmful to your baby can pass through breast milk. It is important to ask your health care provider before taking any medicine, including all over-the-counter and prescription medicine as well as vitamin and herbal supplements. It is possible to become pregnant while breastfeeding. If birth control is desired, ask your health care provider about options that will be safe for your baby. Contact   a health care provider if:  You feel like you want to stop breastfeeding or have become frustrated with breastfeeding.  You have painful breasts or nipples.  Your nipples are cracked or bleeding.  Your breasts are red, tender, or warm.  You have a swollen area on either breast.  You have a fever or chills.  You have nausea or vomiting.  You have drainage other than breast milk from your nipples.  Your breasts do not become full before feedings by the fifth day after you give birth.  You feel sad and depressed.  Your baby is too sleepy to eat well.  Your baby is having trouble sleeping.  Your baby is wetting less than 3 diapers in a 24-hour period.  Your baby has less than 3 stools in a 24-hour period.  Your baby's skin or the white part of his or her eyes becomes yellow.  Your baby is not gaining weight by 5 days of age. Get help right away if:  Your baby is overly tired (lethargic) and does not want to wake up and feed.  Your baby develops an unexplained  fever. This information is not intended to replace advice given to you by your health care provider. Make sure you discuss any questions you have with your health care provider. Document Released: 05/13/2005 Document Revised: 10/25/2015 Document Reviewed: 11/04/2012 Elsevier Interactive Patient Education  2017 [employer]  

## 2017-02-07 NOTE — Progress Notes (Signed)
Babyscripts Optimized Schedule Breastfeeding education done Declines flu

## 2017-02-07 NOTE — Progress Notes (Signed)
  Subjective:    Megan Strong is a [id number] Unknown being seen today for her first obstetrical visit.  Her obstetrical history is significant for uncertain LMP. Patient does intend to breast feed. Pregnancy history fully reviewed.  Patient reports no complaints.  Vitals:   02/07/17 0950  BP: 111/67  Pulse: 76  Weight: 145 lb (65.8 kg)    HISTORY: OB History  Gravida Para Term Preterm AB Living  3       2    SAB TAB Ectopic Multiple Live Births  1            # Outcome Date GA Lbr Len/2nd Weight Sex Delivery Anes PTL Lv  3 Current           2 SAB 09/2016          1 AB 05/2016             Past Medical History:  Diagnosis Date  . Ovarian cyst 2017   Past Surgical History:  Procedure Laterality Date  . NO PAST SURGERIES    . THERAPEUTIC ABORTION     Family History  Problem Relation Age of Onset  . Diabetes Mother   . Rheum arthritis Father      Exam    Uterus:   16-18 weeks  Pelvic Exam:    Perineum: No Hemorrhoids, Normal Perineum   Vulva: normal   Vagina:  normal mucosa, normal discharge   pH:    Cervix: multiparous appearance   Adnexa: normal adnexa and no mass, fullness, tenderness   Bony Pelvis: gynecoid  System: Breast:  normal appearance, no masses or tenderness   Skin: normal coloration and turgor, no rashes    Neurologic: oriented, no focal deficits   Extremities: normal strength, tone, and muscle mass   HEENT extra ocular movement intact   Mouth/Teeth mucous membranes moist, pharynx normal without lesions and dental hygiene good   Neck supple and no masses   Cardiovascular: regular rate and rhythm   Respiratory:  chest clear, no wheezing, crepitations, rhonchi, normal symmetric air entry   Abdomen: soft, non-tender; bowel sounds normal; no masses,  no organomegaly   Urinary:       Assessment:    Pregnancy: [id number] Patient Active Problem List   Diagnosis Date Noted  . Supervision of other normal pregnancy, antepartum 02/07/2017         Plan:     Initial labs drawn. Prenatal vitamins. Problem list reviewed and updated. Genetic Screening discussed Quad Screen: Will be performed at next visit pending dating.  Ultrasound discussed; fetal survey: ordered. Patient is interested in BRx optimization schedule  Follow up in 4 weeks pending enrollment in Brx 50% of 30 min visit spent on counseling and coordination of care.     Megan Strong 02/07/2017

## 2017-02-07 NOTE — Addendum Note (Signed)
Addended by: Arne Cleveland on: 02/07/2017 06:52 PM   Modules accepted: Orders

## 2017-02-09 LAB — URINE CULTURE, OB REFLEX

## 2017-02-09 LAB — CULTURE, OB URINE

## 2017-02-10 LAB — CYTOLOGY - PAP
Chlamydia: NEGATIVE
Diagnosis: NEGATIVE
NEISSERIA GONORRHEA: NEGATIVE

## 2017-02-14 ENCOUNTER — Ambulatory Visit (HOSPITAL_COMMUNITY)
Admission: RE | Admit: 2017-02-14 | Discharge: 2017-02-14 | Disposition: A | Discharge status: Home / Self Care | Payer: Medicaid Other | Source: Ambulatory Visit | Attending: Obstetrics and Gynecology | Admitting: Obstetrics and Gynecology

## 2017-02-14 ENCOUNTER — Encounter (HOSPITAL_COMMUNITY): Payer: Self-pay

## 2017-02-14 DIAGNOSIS — Z3A19 19 weeks gestation of pregnancy: Secondary | ICD-10-CM | POA: Insufficient documentation

## 2017-02-14 DIAGNOSIS — Z3400 Encounter for supervision of normal first pregnancy, unspecified trimester: Secondary | ICD-10-CM

## 2017-02-14 DIAGNOSIS — Z3689 Encounter for other specified antenatal screening: Secondary | ICD-10-CM | POA: Insufficient documentation

## 2017-02-14 DIAGNOSIS — Z3687 Encounter for antenatal screening for uncertain dates: Secondary | ICD-10-CM | POA: Insufficient documentation

## 2017-02-14 LAB — OBSTETRIC PANEL, INCLUDING HIV
ANTIBODY SCREEN: NEGATIVE
BASOS: 0 %
Basophils Absolute: 0 10*3/uL (ref 0.0–0.2)
EOS (ABSOLUTE): 0.1 10*3/uL (ref 0.0–0.4)
EOS: 2 %
HEMATOCRIT: 30 % — AB (ref 34.0–46.6)
HEMOGLOBIN: 10.1 g/dL — AB (ref 11.1–15.9)
HEP B S AG: NEGATIVE
HIV Screen 4th Generation wRfx: NONREACTIVE
IMMATURE GRANS (ABS): 0 10*3/uL (ref 0.0–0.1)
IMMATURE GRANULOCYTES: 0 %
LYMPHS ABS: 1.4 10*3/uL (ref 0.7–3.1)
LYMPHS: 22 %
MCH: 29.1 pg (ref 26.6–33.0)
MCHC: 33.7 g/dL (ref 31.5–35.7)
MCV: 87 fL (ref 79–97)
Monocytes Absolute: 0.4 10*3/uL (ref 0.1–0.9)
Monocytes: 6 %
Neutrophils Absolute: 4.5 10*3/uL (ref 1.4–7.0)
Neutrophils: 70 %
Platelets: 215 10*3/uL (ref 150–379)
RBC: 3.47 x10E6/uL — AB (ref 3.77–5.28)
RDW: 14.6 % (ref 12.3–15.4)
RH TYPE: POSITIVE
RPR: NONREACTIVE
Rubella Antibodies, IGG: 1.06 index (ref >0.99)
WBC: 6.4 10*3/uL (ref 3.4–10.8)

## 2017-02-14 LAB — HEMOGLOBINOPATHY EVALUATION
FERRITIN: 11 ng/mL — AB (ref 15–150)
HGB C: 0 %
HGB F QUANT: 0 % (ref 0.0–2.0)
HGB SOLUBILITY: POSITIVE — AB
Hgb A2 Quant: 3.9 % — ABNORMAL HIGH (ref 1.8–3.2)
Hgb A: 55.8 % — ABNORMAL LOW (ref 96.4–98.8)
Hgb S: 40.3 % — ABNORMAL HIGH
Hgb Variant: 0 %

## 2017-02-14 LAB — CYSTIC FIBROSIS MUTATION 97: GENE DIS ANAL CARRIER INTERP BLD/T-IMP: NOT DETECTED

## 2017-02-14 LAB — GLUCOSE TOLERANCE, 1 HOUR: Glucose, 1Hr PP: 70 mg/dL (ref 65–199)

## 2017-02-14 NOTE — Addendum Note (Signed)
Encounter addended by: Levonne Hubert, RDMS, RVT on: 02/14/2017  3:12 PM<BR>    Actions taken: Imaging Exam ended

## 2017-02-17 LAB — SMN1 COPY NUMBER ANALYSIS (SMA CARRIER SCREENING)

## 2017-02-18 ENCOUNTER — Encounter: Payer: Self-pay | Admitting: Obstetrics and Gynecology

## 2017-02-18 DIAGNOSIS — D573 Sickle-cell trait: Secondary | ICD-10-CM | POA: Insufficient documentation

## 2017-03-07 ENCOUNTER — Ambulatory Visit (INDEPENDENT_AMBULATORY_CARE_PROVIDER_SITE_OTHER): Payer: Self-pay | Admitting: Family Medicine

## 2017-03-07 VITALS — BP 109/63 | HR 75 | Wt 116.3 lb

## 2017-03-07 DIAGNOSIS — Z3689 Encounter for other specified antenatal screening: Secondary | ICD-10-CM

## 2017-03-07 DIAGNOSIS — Z3480 Encounter for supervision of other normal pregnancy, unspecified trimester: Secondary | ICD-10-CM

## 2017-03-07 DIAGNOSIS — D573 Sickle-cell trait: Secondary | ICD-10-CM

## 2017-03-07 MED ORDER — FERROUS SULFATE 325 (65 FE) MG PO TABS: 325.0000 mg | ORAL_TABLET | Freq: Every day | ORAL | 1 refills | Status: DC

## 2017-03-07 NOTE — Progress Notes (Signed)
Pt c/o 2 episodes or lightheadedness/seeing spots this week. Both times while cooking.

## 2017-03-07 NOTE — Patient Instructions (Signed)
Second Trimester of Pregnancy The second trimester is from week 14 through week 27 (months 4 through 6). The second trimester is often a time when you feel your best. Your body has adjusted to being pregnant, and you begin to feel better physically. Usually, morning sickness has lessened or quit completely, you may have more energy, and you may have an increase in appetite. The second trimester is also a time when the fetus is growing rapidly. At the end of the sixth month, the fetus is about 9 inches long and weighs about 1 pounds. You will likely begin to feel the baby move (quickening) between 16 and 20 weeks of pregnancy. Body changes during your second trimester Your body continues to go through many changes during your second trimester. The changes vary from woman to woman.  Your weight will continue to increase. You will notice your lower abdomen bulging out.  You may begin to get stretch marks on your hips, abdomen, and breasts.  You may develop headaches that can be relieved by medicines. The medicines should be approved by your health care provider.  You may urinate more often because the fetus is pressing on your bladder.  You may develop or continue to have heartburn as a result of your pregnancy.  You may develop constipation because certain hormones are causing the muscles that push waste through your intestines to slow down.  You may develop hemorrhoids or swollen, bulging veins (varicose veins).  You may have back pain. This is caused by: ? Weight gain. ? Pregnancy hormones that are relaxing the joints in your pelvis. ? A shift in weight and the muscles that support your balance.  Your breasts will continue to grow and they will continue to become tender.  Your gums may bleed and may be sensitive to brushing and flossing.  Dark spots or blotches (chloasma, mask of pregnancy) may develop on your face. This will likely fade after the baby is born.  A dark line from your  belly button to the pubic area (linea nigra) may appear. This will likely fade after the baby is born.  You may have changes in your hair. These can include thickening of your hair, rapid growth, and changes in texture. Some women also have hair loss during or after pregnancy, or hair that feels dry or thin. Your hair will most likely return to normal after your baby is born.  What to expect at prenatal visits During a routine prenatal visit:  You will be weighed to make sure you and the fetus are growing normally.  Your blood pressure will be taken.  Your abdomen will be measured to track your baby's growth.  The fetal heartbeat will be listened to.  Any test results from the previous visit will be discussed.  Your health care provider may ask you:  How you are feeling.  If you are feeling the baby move.  If you have had any abnormal symptoms, such as leaking fluid, bleeding, severe headaches, or abdominal cramping.  If you are using any tobacco products, including cigarettes, chewing tobacco, and electronic cigarettes.  If you have any questions.  Other tests that may be performed during your second trimester include:  Blood tests that check for: ? Low iron levels (anemia). ? High blood sugar that affects pregnant women (gestational diabetes) between 24 and 28 weeks. ? Rh antibodies. This is to check for a protein on red blood cells (Rh factor).  Urine tests to check for infections, diabetes, or   protein in the urine.  An ultrasound to confirm the proper growth and development of the baby.  An amniocentesis to check for possible genetic problems.  Fetal screens for spina bifida and Down syndrome.  HIV (human immunodeficiency virus) testing. Routine prenatal testing includes screening for HIV, unless you choose not to have this test.  Follow these instructions at home: Medicines  Follow your health care provider's instructions regarding medicine use. Specific  medicines may be either safe or unsafe to take during pregnancy.  Take a prenatal vitamin that contains at least 600 micrograms (mcg) of folic acid.  If you develop constipation, try taking a stool softener if your health care provider approves. Eating and drinking  Eat a balanced diet that includes fresh fruits and vegetables, whole grains, good sources of protein such as meat, eggs, or tofu, and low-fat dairy. Your health care provider will help you determine the amount of weight gain that is right for you.  Avoid raw meat and uncooked cheese. These carry germs that can cause birth defects in the baby.  If you have low calcium intake from food, talk to your health care provider about whether you should take a daily calcium supplement.  Limit foods that are high in fat and processed sugars, such as fried and sweet foods.  To prevent constipation: ? Drink enough fluid to keep your urine clear or pale yellow. ? Eat foods that are high in fiber, such as fresh fruits and vegetables, whole grains, and beans. Activity  Exercise only as directed by your health care provider. Most women can continue their usual exercise routine during pregnancy. Try to exercise for 30 minutes at least 5 days a week. Stop exercising if you experience uterine contractions.  Avoid heavy lifting, wear low heel shoes, and practice good posture.  A sexual relationship may be continued unless your health care provider directs you otherwise. Relieving pain and discomfort  Wear a good support bra to prevent discomfort from breast tenderness.  Take warm sitz baths to soothe any pain or discomfort caused by hemorrhoids. Use hemorrhoid cream if your health care provider approves.  Rest with your legs elevated if you have leg cramps or low back pain.  If you develop varicose veins, wear support hose. Elevate your feet for 15 minutes, 3-4 times a day. Limit salt in your diet. Prenatal Care  Write down your questions.  Take them to your prenatal visits.  Keep all your prenatal visits as told by your health care provider. This is important. Safety  Wear your seat belt at all times when driving.  Make a list of emergency phone numbers, including numbers for family, friends, the hospital, and police and fire departments. General instructions  Ask your health care provider for a referral to a local prenatal education class. Begin classes no later than the beginning of month 6 of your pregnancy.  Ask for help if you have counseling or nutritional needs during pregnancy. Your health care provider can offer advice or refer you to specialists for help with various needs.  Do not use hot tubs, steam rooms, or saunas.  Do not douche or use tampons or scented sanitary pads.  Do not cross your legs for long periods of time.  Avoid cat litter boxes and soil used by cats. These carry germs that can cause birth defects in the baby and possibly loss of the fetus by miscarriage or stillbirth.  Avoid all smoking, herbs, alcohol, and unprescribed drugs. Chemicals in these products can   affect the formation and growth of the baby.  Do not use any products that contain nicotine or tobacco, such as cigarettes and e-cigarettes. If you need help quitting, ask your health care provider.  Visit your dentist if you have not gone yet during your pregnancy. Use a soft toothbrush to brush your teeth and be gentle when you floss. Contact a health care provider if:  You have dizziness.  You have mild pelvic cramps, pelvic pressure, or nagging pain in the abdominal area.  You have persistent nausea, vomiting, or diarrhea.  You have a bad smelling vaginal discharge.  You have pain when you urinate. Get help right away if:  You have a fever.  You are leaking fluid from your vagina.  You have spotting or bleeding from your vagina.  You have severe abdominal cramping or pain.  You have rapid weight gain or weight  loss.  You have shortness of breath with chest pain.  You notice sudden or extreme swelling of your face, hands, ankles, feet, or legs.  You have not felt your baby move in over an hour.  You have severe headaches that do not go away when you take medicine.  You have vision changes. Summary  The second trimester is from week 14 through week 27 (months 4 through 6). It is also a time when the fetus is growing rapidly.  Your body goes through many changes during pregnancy. The changes vary from woman to woman.  Avoid all smoking, herbs, alcohol, and unprescribed drugs. These chemicals affect the formation and growth your baby.  Do not use any tobacco products, such as cigarettes, chewing tobacco, and e-cigarettes. If you need help quitting, ask your health care provider.  Contact your health care provider if you have any questions. Keep all prenatal visits as told by your health care provider. This is important. This information is not intended to replace advice given to you by your health care provider. Make sure you discuss any questions you have with your health care provider. Document Released: 05/07/2001 Document Revised: 10/19/2015 Document Reviewed: 07/14/2012 Elsevier Interactive Patient Education  2017 [employer].   Contraception Choices Contraception, also called birth control, means things to use or ways to try not to get pregnant. Hormonal birth control This kind of birth control uses hormones. Here are some types of hormonal birth control:  A tube that is put under skin of the arm (implant). The tube can stay in for as long as 3 years.  Shots to get every 3 months (injections).  Pills to take every day (birth control pills).  A patch to change 1 time each week for 3 weeks (birth control patch). After that, the patch is taken off for 1 week.  A ring to put in the vagina. The ring is left in for 3 weeks. Then it is taken out of the vagina for 1 week. Then a new  ring is put in.  Pills to take after unprotected sex (emergency birth control pills).  Barrier birth control Here are some types of barrier birth control:  A thin covering that is put on the penis before sex (female condom). The covering is thrown away after sex.  A soft, loose covering that is put in the vagina before sex (female condom). The covering is thrown away after sex.  A rubber bowl that sits over the cervix (diaphragm). The bowl must be made for you. The bowl is put into the vagina before sex. The bowl is left  in for 6-8 hours after sex. It is taken out within 24 hours.  A small, soft cup that fits over the cervix (cervical cap). The cup must be made for you. The cup can be left in for 6-8 hours after sex. It is taken out within 48 hours.  A sponge that is put into the vagina before sex. It must be left in for at least 6 hours after sex. It must be taken out within 30 hours. Then it is thrown away.  A chemical that kills or stops sperm from getting into the uterus (spermicide). It may be a pill, cream, jelly, or foam to put in the vagina. The chemical should be used at least 10-15 minutes before sex.  IUD (intrauterine) birth control An IUD is a small, T-shaped piece of plastic. It is put inside the uterus. There are two kinds:  Hormone IUD. This kind can stay in for 3-5 years.  Copper IUD. This kind can stay in for 10 years.  Permanent birth control Here are some types of permanent birth control:  Surgery to block the fallopian tubes.  Having an insert put into each fallopian tube.  Surgery to tie off the tubes that carry sperm (vasectomy).  Natural planning birth control Here are some types of natural planning birth control:  Not having sex on the days the woman could get pregnant.  Using a calendar: ? To keep track of the length of each period. ? To find out what days pregnancy can happen. ? To plan to not have sex on days when pregnancy can happen.  Watching  for symptoms of ovulation and not having sex during ovulation. One way the woman can check for ovulation is to check her temperature.  Waiting to have sex until after ovulation.  Summary  Contraception, also called birth control, means things to use or ways to try not to get pregnant.  Hormonal methods of birth control include implants, injections, pills, patches, vaginal rings, and emergency birth control pills.  Barrier methods of birth control can include female condoms, female condoms, diaphragms, cervical caps, sponges, and spermicides.  There are two types of IUD (intrauterine device) birth control. An IUD can be put in a woman's uterus to prevent pregnancy for 3-5 years.  Permanent sterilization can be done through a procedure for males, females, or both.  Natural planning methods involve not having sex on the days when the woman could get pregnant. This information is not intended to replace advice given to you by your health care provider. Make sure you discuss any questions you have with your health care provider. Document Released: 03/10/2009 Document Revised: 05/23/2016 Document Reviewed: 05/23/2016 Elsevier Interactive Patient Education  2017 [employer].  AREA PEDIATRIC/FAMILY PRACTICE PHYSICIANS  [facility] [address] [place], [place]  [address] Phone - [phone]   Fax - [phone]  [facility] [address] [place], [place] [address] Phone - [phone]   Fax - [phone]  [facility] [address] [place], [place]  [address] Phone - [phone]   Fax - [phone]  [facility] [address] [place], [place]  [address] Phone - [phone]   Fax - [phone]  [facility] [address] [place], [place]  [address] Phone - [phone]   Fax - [phone]  [facility] [address] [place], [place]  [address] Phone - [phone]    Fax - [phone]  [facility] [place] [address] [place], [place]  [address] Phone - [phone]   Fax - [phone]  [facility] [address] [place] [place], [place]  [address] Phone - [phone]   Fax - [phone]  [facility] [address] [place], [place]  [address] Phone - [phone]   Fax - [phone] [facility] [place] [address] [place], [place]  [address] Phone - [phone]   Fax - [phone]  [facility] [address] [place], [place]  [address] Phone - [phone]   Fax - [phone]  [facility] [address] [place], [place]  [address] Phone - [phone]   Fax - [phone]  [facility] [place] [address] [place], [place]  [address] Phone - [phone]   Fax - [phone]  [facility] [address] [place], [place]  [address] Phone - [phone]  [facility] [address] [place], [place]  [address] Phone - [phone]   Fax - [phone]  [facility] [address] [place], [place]  [address] Phone - [phone]   Fax - [phone]  [facility] [address] [place], [place]  [address] Phone - [phone]   Fax - [phone]  [facility] [address] [place], [place]  [address] Phone - [phone]   Fax - [phone]   [facility] [address] [place], [place]  [address] Phone - [phone]   Fax - [phone]  [facility] [address] [place], [place]  [address] Phone - [phone]   Fax - [phone]  DAVID RUBIN [address] [place], [place]  [address] Phone - [phone]   Fax - [phone]  [facility] [address] [place], [place]  [address] Phone - [phone]   Fax - [phone]  [facility] Alita Chyle [address] [place], [place]  [address] Phone - [phone]   Fax - [phone] Gerarda Fraction [address] [place], [place]  [address] Phone - [phone]   Fax - [phone]  [facility] [address] [place], [place]  [address] Phone - [phone]   Fax - [phone]  [facility] [place] [address] [place], [place]  [address] Phone - [phone]   Fax - [phone]  [facility] [place] Wyvonne Lenz MD [address] [place] [address] Phone [phone]  Fax [phone]

## 2017-03-07 NOTE — Progress Notes (Signed)
   PRENATAL VISIT NOTE  Subjective:  Megan Strong is a 21 y.o. G2P0010 at [redacted]w[redacted]d being seen today for ongoing prenatal care.  She is currently monitored for the following issues for this low-risk pregnancy and has Supervision of other normal pregnancy, antepartum and Sickle cell trait (HCC) on her problem list.  Patient reports no complaints.  Contractions: Not present. Vag. Bleeding: None.  Movement: Present. Denies leaking of fluid.   The following portions of the patient's history were reviewed and updated as appropriate: allergies, current medications, past family history, past medical history, past social history, past surgical history and problem list. Problem list updated.  Objective:   Vitals:   03/07/17 0840  BP: 109/63  Pulse: 75  Weight: 116 lb 4.8 oz (52.8 kg)    Fetal Status: Fetal Heart Rate (bpm): 140   Movement: Present     General:  Alert, oriented and cooperative. Patient is in no acute distress.  Skin: Skin is warm and dry. No rash noted.   Cardiovascular: Normal heart rate noted  Respiratory: Normal respiratory effort, no problems with respiration noted  Abdomen: Soft, gravid, appropriate for gestational age.  Pain/Pressure: Absent     Pelvic: Cervical exam deferred        Extremities: Normal range of motion.  Edema: None  Mental Status:  Normal mood and affect. Normal behavior. Normal judgment and thought content.   Assessment and Plan:  Pregnancy: G2P0010 at [redacted]w[redacted]d  1. Supervision of other normal pregnancy, antepartum - Intake labs reviewed - Needs growth scan. Ordered.  - Dicussed postpartum contraception - List of pediatricians given  2. Sickle cell trait (HCC) - FOB testing offered, declined.   Preterm labor symptoms and general obstetric precautions including but not limited to vaginal bleeding, contractions, leaking of fluid and fetal movement were reviewed in detail with the patient. Please refer to After Visit Summary for other counseling  recommendations.  Return in about 4 weeks (around 04/04/2017) for LOB.   Frederik Pear, MD  Future Appointments Date Time Provider Department Center  04/03/2017 9:00 AM Crisoforo [place], Charlesetta Garibaldi, CNM [facility]

## 2017-03-10 ENCOUNTER — Telehealth: Payer: Self-pay | Admitting: *Deleted

## 2017-03-10 NOTE — Telephone Encounter (Signed)
Patient has been scheduled for a f/u u/s Friday, Oct 19 @ 1345. Attempted to call patient with appointment info, no answer. Left voice mail stating I am calling with appointment info, please return my call.

## 2017-03-10 NOTE — Addendum Note (Signed)
Addended by: Garret Reddish on: 03/10/2017 09:56 AM   Modules accepted: Orders

## 2017-03-11 NOTE — Telephone Encounter (Signed)
Called patient & informed her of appt. Patient verbalized understanding & had no questions. 

## 2017-03-14 ENCOUNTER — Ambulatory Visit (HOSPITAL_COMMUNITY)
Admission: RE | Admit: 2017-03-14 | Discharge: 2017-03-14 | Disposition: A | Discharge status: Home / Self Care | Payer: Medicaid Other | Source: Ambulatory Visit | Attending: Family Medicine | Admitting: Family Medicine

## 2017-03-14 DIAGNOSIS — Z3689 Encounter for other specified antenatal screening: Secondary | ICD-10-CM | POA: Insufficient documentation

## 2017-03-14 DIAGNOSIS — Z362 Encounter for other antenatal screening follow-up: Secondary | ICD-10-CM | POA: Diagnosis not present

## 2017-03-14 DIAGNOSIS — Z3480 Encounter for supervision of other normal pregnancy, unspecified trimester: Secondary | ICD-10-CM | POA: Diagnosis not present

## 2017-03-28 ENCOUNTER — Encounter (HOSPITAL_COMMUNITY): Payer: Self-pay | Admitting: Emergency Medicine

## 2017-03-28 ENCOUNTER — Emergency Department (HOSPITAL_COMMUNITY)
Admission: EM | Admit: 2017-03-28 | Discharge: 2017-03-29 | Disposition: A | Discharge status: Home / Self Care | Payer: Medicaid Other | Attending: Emergency Medicine | Admitting: Emergency Medicine

## 2017-03-28 DIAGNOSIS — R300 Dysuria: Secondary | ICD-10-CM | POA: Insufficient documentation

## 2017-03-28 DIAGNOSIS — Z79899 Other long term (current) drug therapy: Secondary | ICD-10-CM | POA: Insufficient documentation

## 2017-03-28 DIAGNOSIS — R1031 Right lower quadrant pain: Secondary | ICD-10-CM | POA: Diagnosis not present

## 2017-03-28 LAB — COMPREHENSIVE METABOLIC PANEL
ALBUMIN: 3.5 g/dL (ref 3.5–5.0)
ALT: 18 U/L (ref 14–54)
ANION GAP: 7 (ref 5–15)
AST: 19 U/L (ref 15–41)
Alkaline Phosphatase: 44 U/L (ref 38–126)
BUN: 5 mg/dL — ABNORMAL LOW (ref 6–20)
CO2: 25 mmol/L (ref 22–32)
Calcium: 9.1 mg/dL (ref 8.9–10.3)
Chloride: 103 mmol/L (ref 101–111)
Creatinine, Ser: 0.52 mg/dL (ref 0.44–1.00)
GFR calc Af Amer: >60 mL/min (ref >60)
GFR calc non Af Amer: >60 mL/min (ref >60)
GLUCOSE: 92 mg/dL (ref 65–99)
POTASSIUM: 3.2 mmol/L — AB (ref 3.5–5.1)
SODIUM: 135 mmol/L (ref 135–145)
Total Bilirubin: 0.3 mg/dL (ref 0.3–1.2)
Total Protein: 7.3 g/dL (ref 6.5–8.1)

## 2017-03-28 LAB — URINALYSIS, ROUTINE W REFLEX MICROSCOPIC
Bilirubin Urine: NEGATIVE
Glucose, UA: NEGATIVE mg/dL
Hgb urine dipstick: NEGATIVE
Ketones, ur: NEGATIVE mg/dL
Nitrite: NEGATIVE
PROTEIN: NEGATIVE mg/dL
Specific Gravity, Urine: 1.013 (ref 1.005–1.030)
pH: 6 (ref 5.0–8.0)

## 2017-03-28 LAB — LIPASE, BLOOD: LIPASE: 31 U/L (ref 11–51)

## 2017-03-28 LAB — CBC
HEMATOCRIT: 27.9 % — AB (ref 36.0–46.0)
HEMOGLOBIN: 9.5 g/dL — AB (ref 12.0–15.0)
MCH: 29.1 pg (ref 26.0–34.0)
MCHC: 34.1 g/dL (ref 30.0–36.0)
MCV: 85.6 fL (ref 78.0–100.0)
Platelets: 216 10*3/uL (ref 150–400)
RBC: 3.26 MIL/uL — ABNORMAL LOW (ref 3.87–5.11)
RDW: 12.8 % (ref 11.5–15.5)
WBC: 8.4 10*3/uL (ref 4.0–10.5)

## 2017-03-28 LAB — HCG, QUANTITATIVE, PREGNANCY: HCG, BETA CHAIN, QUANT, S: 24150 m[IU]/mL — AB (ref <5)

## 2017-03-28 NOTE — Progress Notes (Signed)
RROB called to patient's bedside [facility] with complaints of abdominal pain x1 week that has intensified over the last 3 days; patient describes pain as a shooting pain in her lower right abdomen; no increased tenderness with palpation at this time; patient reports good fetal movement and denies bleeding, cramping, contractions, of leaking of fluid; pt reports pregnancy as uncomplicated; she also reports some pressure when she has to void; patient is a G2P0 who is 24 and 2/[redacted] weeks along in her pregnancy at this time; EFM applied and assessing at this time

## 2017-03-28 NOTE — ED Triage Notes (Signed)
Patient c/o RLQ abdominal pain onset of a couple days ago. Also c/o pressure feeling while voiding. Patient 5 months pregnant. [facility]

## 2017-03-28 NOTE — ED Provider Notes (Signed)
[facility]-EMERGENCY DEPT Provider Note   CSN: [id number] Arrival date & time: 03/28/17  1911     History   Chief Complaint Chief Complaint  Patient presents with  . Abdominal Pain    HPI Megan Strong is a 21 y.o. female.  G2P0001 patient who is [redacted] weeks along presents to the ED with a chief complaint of RLQ pain.  She reports RLQ pain x 2-3 days.  She reports that the symptoms have been worsening.  She reports associated dysuria as well as pressure with urination.  She denies any vaginal discharge or bleeding.  She denies any fevers, chills, nausea, or vomiting.  She has not taken anything for her symptoms.  She denies any other associated symptoms.  There are no modifying factors.   The history is provided by the patient. No language interpreter was used.    Past Medical History:  Diagnosis Date  . Ovarian cyst 2017    Patient Active Problem List   Diagnosis Date Noted  . Sickle cell trait (HCC) 02/18/2017  . Supervision of other normal pregnancy, antepartum 02/07/2017    Past Surgical History:  Procedure Laterality Date  . NO PAST SURGERIES    . THERAPEUTIC ABORTION      OB History    Gravida Para Term Preterm AB Living   2 0 0 0 1 0   SAB TAB Ectopic Multiple Live Births   0 0 0 0 0       Home Medications    Prior to Admission medications   Medication Sig Start Date End Date Taking? Authorizing Provider  ferrous sulfate 325 (65 FE) MG tablet Take 1 tablet (325 mg total) by mouth daily with breakfast. 03/07/17   Degele, Kandra NicolasJulie P, MD  Prenatal Vit-Fe Fumarate-FA (PRENATAL VITAMIN PO) Take 1 tablet by mouth daily.    [provider]    Family History Family History  Problem Relation Age of Onset  . Diabetes Mother   . Rheum arthritis Father     Social History Social History  Substance Use Topics  . Smoking status: Never Smoker  . Smokeless tobacco: Never Used  . Alcohol use No     Allergies   Patient has no  known allergies.   Review of Systems Review of Systems  All other systems reviewed and are negative.    Physical Exam Updated Vital Signs BP 108/76 (BP Location: Right Arm)   Pulse 89   Temp (!) 97.5 F (36.4 C) (Oral)   Resp 18   LMP 11/01/2016 Comment: irregular periods  SpO2 100%   Physical Exam  Constitutional: She is oriented to person, place, and time. She appears well-developed and well-nourished.  HENT:  Head: Normocephalic and atraumatic.  Eyes: Pupils are equal, round, and reactive to light. Conjunctivae and EOM are normal.  Neck: Normal range of motion. Neck supple.  Cardiovascular: Normal rate and regular rhythm.  Exam reveals no gallop and no friction rub.   No murmur heard. Pulmonary/Chest: Effort normal and breath sounds normal. No respiratory distress. She has no wheezes. She has no rales. She exhibits no tenderness.  Abdominal: Soft. Bowel sounds are normal. She exhibits no distension and no mass. There is no tenderness. There is no rebound and no guarding.  Gravid abdomen, no focal abdominal tenderness  Musculoskeletal: Normal range of motion. She exhibits no edema or tenderness.  Neurological: She is alert and oriented to person, place, and time.  Skin: Skin is warm and dry.  Psychiatric:  She has a normal mood and affect. Her behavior is normal. Judgment and thought content normal.  Nursing note and vitals reviewed.    ED Treatments / Results  Labs (all labs ordered are listed, but only abnormal results are displayed) Labs Reviewed  COMPREHENSIVE METABOLIC PANEL - Abnormal; Notable for the following:       Result Value   Potassium 3.2 (*)    BUN 5 (*)    All other components within normal limits  CBC - Abnormal; Notable for the following:    RBC 3.26 (*)    Hemoglobin 9.5 (*)    HCT 27.9 (*)    All other components within normal limits  URINALYSIS, ROUTINE W REFLEX MICROSCOPIC - Abnormal; Notable for the following:    APPearance CLOUDY (*)     Leukocytes, UA LARGE (*)    Bacteria, UA FEW (*)    Squamous Epithelial / LPF 6-30 (*)    All other components within normal limits  HCG, QUANTITATIVE, PREGNANCY - Abnormal; Notable for the following:    hCG, Beta Chain, Quant, S 24,150 (*)    All other components within normal limits  LIPASE, BLOOD    EKG  EKG Interpretation None       Radiology No results found.  Procedures Procedures (including critical care time)  Medications Ordered in ED Medications - No data to display   Initial Impression / Assessment and Plan / ED Course  I have reviewed the triage vital signs and the nursing notes.  Pertinent labs & imaging results that were available during my care of the patient were reviewed by me and considered in my medical decision making (see chart for details).     Pregnant patient with RLQ pain.  No focal tenderness in the abdomen.  She reports associated dysuria.  Labs are unremarkable.  UA seems consistent with UTI.  Rapid response paged.   Patient cleared by OB.  Suspect round ligament pain vs UTI.  I will send urine for culture.  Patient is dressed and standing at the doorway asking to be discharged because her Benedetto Goad has arrived.  She is stable appearing and ready for discharge.  Final Clinical Impressions(s) / ED Diagnoses   Final diagnoses:  Dysuria  Right lower quadrant abdominal pain    New Prescriptions New Prescriptions   No medications on file     Roxy Horseman, Cordelia Poche 03/29/17 0109    Melene Plan, DO 03/31/17 1011

## 2017-03-29 MED ORDER — CEPHALEXIN 500 MG PO CAPS: 500.0000 mg | ORAL_CAPSULE | Freq: Two times a day (BID) | ORAL | 0 refills | Status: DC

## 2017-03-29 NOTE — Progress Notes (Signed)
Dr Earlene Plateravis called and notified of patient complaint and [facility]; informed of UA results and EFM tracing; [facility] and to follow up with OB; verbal instructions given to patient to follow up with OB and [facility] if increased pain, LOF, bleeding or decreased fetal movement noted; patient verbalized understanding at this time and denies questions or concerns at this time; ED staff notified

## 2017-03-30 LAB — URINE CULTURE

## 2017-04-03 ENCOUNTER — Encounter: Payer: Self-pay | Admitting: Obstetrics and Gynecology

## 2017-04-03 ENCOUNTER — Ambulatory Visit (INDEPENDENT_AMBULATORY_CARE_PROVIDER_SITE_OTHER): Payer: Self-pay | Admitting: Student

## 2017-04-03 VITALS — BP 112/60 | HR 77 | Wt 123.0 lb

## 2017-04-03 DIAGNOSIS — O9989 Other specified diseases and conditions complicating pregnancy, childbirth and the puerperium: Secondary | ICD-10-CM

## 2017-04-03 DIAGNOSIS — R1030 Lower abdominal pain, unspecified: Secondary | ICD-10-CM

## 2017-04-03 DIAGNOSIS — O99891 Other specified diseases and conditions complicating pregnancy: Secondary | ICD-10-CM | POA: Insufficient documentation

## 2017-04-03 DIAGNOSIS — Z3480 Encounter for supervision of other normal pregnancy, unspecified trimester: Secondary | ICD-10-CM

## 2017-04-03 NOTE — Patient Instructions (Signed)

## 2017-04-03 NOTE — Progress Notes (Signed)
   PRENATAL VISIT NOTE  Subjective:  Megan Strong is a 21 y.o. G2P0010 at 1157w1d being seen today for ongoing prenatal care.  She is currently monitored for the following issues for this low-risk pregnancy and has Supervision of other normal pregnancy, antepartum; Sickle cell trait (HCC); and Pregnancy with suprapubic pain, antepartum on their problem list.  Patient reports pressure with urination, but no burning or low back pain. .  Contractions: Not present. Vag. Bleeding: None.  Movement: Present. Denies leaking of fluid.   The following portions of the patient's history were reviewed and updated as appropriate: allergies, current medications, past family history, past medical history, past social history, past surgical history and problem list. Problem list updated.  Objective:   Vitals:   04/03/17 0923  BP: 112/60  Pulse: 77  Weight: 123 lb (55.8 kg)    Fetal Status: Fetal Heart Rate (bpm): 147 Fundal Height: 26 cm Movement: Present     General:  Alert, oriented and cooperative. Patient is in no acute distress.  Skin: Skin is warm and dry. No rash noted.   Cardiovascular: Normal heart rate noted  Respiratory: Normal respiratory effort, no problems with respiration noted  Abdomen: Soft, gravid, appropriate for gestational age.  Pain/Pressure: Present     Pelvic: Cervical exam deferred        Extremities: Normal range of motion.  Edema: None  Mental Status:  Normal mood and affect. Normal behavior. Normal judgment and thought content.   Assessment and Plan:  Pregnancy: G2P0010 at 6257w1d  1. Supervision of other normal pregnancy, antepartum -2 hour gtt next visit  2. Pregnancy with suprapubic pain, antepartum -OB urine culture pending; will keep on abx until culture results come in -recommend probiotics.   Preterm labor symptoms and general obstetric precautions including but not limited to vaginal bleeding, contractions, leaking of fluid and fetal movement were reviewed in  detail with the patient. Please refer to After Visit Summary for other counseling recommendations.  Return in about 2 weeks (around 04/17/2017) for LROB and 2 hour GTT.   Marylene LandKathryn Lorraine [place] Want, CNM

## 2017-04-05 LAB — CULTURE, OB URINE

## 2017-04-05 LAB — URINE CULTURE, OB REFLEX

## 2017-04-08 ENCOUNTER — Telehealth: Payer: Self-pay | Admitting: Student

## 2017-04-08 NOTE — Telephone Encounter (Signed)
Called patient about her negative urine results but no answer and voicemail not set up.  Will call back.

## 2017-04-08 NOTE — Telephone Encounter (Signed)
Spoke with patient and told her that she did not have a UTI-she can stop taking keflex.

## 2017-04-24 ENCOUNTER — Encounter: Payer: Self-pay | Admitting: General Practice

## 2017-04-24 ENCOUNTER — Encounter: Payer: Self-pay | Admitting: Obstetrics and Gynecology

## 2017-04-24 ENCOUNTER — Ambulatory Visit (INDEPENDENT_AMBULATORY_CARE_PROVIDER_SITE_OTHER): Payer: Medicaid Other | Admitting: Obstetrics and Gynecology

## 2017-04-24 VITALS — BP 123/70 | HR 75 | Wt 124.6 lb

## 2017-04-24 DIAGNOSIS — Z23 Encounter for immunization: Secondary | ICD-10-CM | POA: Diagnosis not present

## 2017-04-24 DIAGNOSIS — O9989 Other specified diseases and conditions complicating pregnancy, childbirth and the puerperium: Secondary | ICD-10-CM

## 2017-04-24 DIAGNOSIS — D573 Sickle-cell trait: Secondary | ICD-10-CM

## 2017-04-24 DIAGNOSIS — Z3483 Encounter for supervision of other normal pregnancy, third trimester: Secondary | ICD-10-CM

## 2017-04-24 DIAGNOSIS — R1030 Lower abdominal pain, unspecified: Secondary | ICD-10-CM

## 2017-04-24 DIAGNOSIS — O99891 Other specified diseases and conditions complicating pregnancy: Secondary | ICD-10-CM

## 2017-04-24 DIAGNOSIS — Z3480 Encounter for supervision of other normal pregnancy, unspecified trimester: Secondary | ICD-10-CM

## 2017-04-24 NOTE — Progress Notes (Signed)
28 wk labs/tdap today

## 2017-04-24 NOTE — Progress Notes (Signed)
   PRENATAL VISIT NOTE  Subjective:  Megan Strong is a 21 y.o. G2P0010 at 8994w1d being seen today for ongoing prenatal care.  She is currently monitored for the following issues for this low-risk pregnancy and has Supervision of other normal pregnancy, antepartum; Sickle cell trait (HCC); and Pregnancy with suprapubic pain, antepartum on their problem list.  Patient reports fatigue.  Contractions: Irritability. Vag. Bleeding: None.  Movement: Present. Denies leaking of fluid.   The following portions of the patient's history were reviewed and updated as appropriate: allergies, current medications, past family history, past medical history, past social history, past surgical history and problem list. Problem list updated.  Objective:   Vitals:   04/24/17 0841  BP: 123/70  Pulse: 75  Weight: 124 lb 9.6 oz (56.5 kg)    Fetal Status: Fetal Heart Rate (bpm): 140 Fundal Height: 28 cm Movement: Present     General:  Alert, oriented and cooperative. Patient is in no acute distress.  Skin: Skin is warm and dry. No rash noted.   Cardiovascular: Normal heart rate noted  Respiratory: Normal respiratory effort, no problems with respiration noted  Abdomen: Soft, gravid, appropriate for gestational age.  Pain/Pressure: Present     Pelvic: Cervical exam deferred        Extremities: Normal range of motion.  Edema: None  Mental Status:  Normal mood and affect. Normal behavior. Normal judgment and thought content.   Assessment and Plan:  Pregnancy: G2P0010 at 6494w1d  1. Encounter for supervision of other normal pregnancy in third trimester - Glucose Tolerance, 2 Hours w/1 Hour - CBC - HIV antibody (with reflex) - RPR - Tdap vaccine greater than or equal to 7yo IM Counseled regarding risks/benefits of flu vaccine, patient declines vaccine.   2. Need for diphtheria-tetanus-pertussis (Tdap) vaccine - Tdap vaccine greater than or equal to 7yo IM  4. Sickle cell trait (HCC) Urine Q  trimester   Preterm labor symptoms and general obstetric precautions including but not limited to vaginal bleeding, contractions, leaking of fluid and fetal movement were reviewed in detail with the patient. Please refer to After Visit Summary for other counseling recommendations.  Return in about 2 weeks (around 05/08/2017) for OB visit.   Conan BowensKelly M Fadi Menter, MD

## 2017-04-25 LAB — GLUCOSE TOLERANCE, 2 HOURS W/ 1HR
Glucose, 1 hour: 118 mg/dL (ref 65–179)
Glucose, 2 hour: 101 mg/dL (ref 65–152)
Glucose, Fasting: 76 mg/dL (ref 65–91)

## 2017-04-25 LAB — CBC
HEMATOCRIT: 26.4 % — AB (ref 34.0–46.6)
HEMOGLOBIN: 8.6 g/dL — AB (ref 11.1–15.9)
MCH: 27.8 pg (ref 26.6–33.0)
MCHC: 32.6 g/dL (ref 31.5–35.7)
MCV: 85 fL (ref 79–97)
Platelets: 221 10*3/uL (ref 150–379)
RBC: 3.09 x10E6/uL — AB (ref 3.77–5.28)
RDW: 13.4 % (ref 12.3–15.4)
WBC: 8.5 10*3/uL (ref 3.4–10.8)

## 2017-04-25 LAB — HIV ANTIBODY (ROUTINE TESTING W REFLEX): HIV Screen 4th Generation wRfx: NONREACTIVE

## 2017-04-25 LAB — RPR: RPR Ser Ql: NONREACTIVE

## 2017-04-26 ENCOUNTER — Encounter: Payer: Self-pay | Admitting: Obstetrics and Gynecology

## 2017-04-26 DIAGNOSIS — O99019 Anemia complicating pregnancy, unspecified trimester: Secondary | ICD-10-CM | POA: Insufficient documentation

## 2017-05-07 ENCOUNTER — Ambulatory Visit (INDEPENDENT_AMBULATORY_CARE_PROVIDER_SITE_OTHER): Payer: Medicaid Other | Admitting: Family Medicine

## 2017-05-07 ENCOUNTER — Encounter: Payer: Self-pay | Admitting: Family Medicine

## 2017-05-07 VITALS — BP 121/73 | HR 89 | Wt 124.6 lb

## 2017-05-07 DIAGNOSIS — D573 Sickle-cell trait: Secondary | ICD-10-CM

## 2017-05-07 DIAGNOSIS — Z3480 Encounter for supervision of other normal pregnancy, unspecified trimester: Secondary | ICD-10-CM

## 2017-05-07 DIAGNOSIS — O99013 Anemia complicating pregnancy, third trimester: Secondary | ICD-10-CM

## 2017-05-07 MED ORDER — FERROUS SULFATE 325 (65 FE) MG PO TABS: 325.0000 mg | ORAL_TABLET | Freq: Three times a day (TID) | ORAL | 2 refills | Status: AC

## 2017-05-07 MED ORDER — POLYETHYLENE GLYCOL 3350 17 GM/SCOOP PO POWD: 17.0000 g | Freq: Every day | ORAL | 0 refills | Status: DC

## 2017-05-07 MED ORDER — DOCUSATE SODIUM 100 MG PO CAPS: 100.0000 mg | ORAL_CAPSULE | Freq: Two times a day (BID) | ORAL | 2 refills | Status: DC

## 2017-05-08 NOTE — Progress Notes (Signed)
   PRENATAL VISIT NOTE  Subjective:  Megan Strong is a 21 y.o. G2P0010 at 9773w1d being seen today for ongoing prenatal care.  She is currently monitored for the following issues for this low-risk pregnancy and has Supervision of other normal pregnancy, antepartum; Sickle cell trait (HCC); and Anemia affecting pregnancy on their problem list.  Patient reports no complaints.  Contractions: Not present. Vag. Bleeding: None.  Movement: Present. Denies leaking of fluid.   The following portions of the patient's history were reviewed and updated as appropriate: allergies, current medications, past family history, past medical history, past social history, past surgical history and problem list. Problem list updated.  Objective:   Vitals:   05/07/17 0921  BP: 121/73  Pulse: 89  Weight: 124 lb 9.6 oz (56.5 kg)    Fetal Status: Fetal Heart Rate (bpm): 143 Fundal Height: 31 cm Movement: Present     General:  Alert, oriented and cooperative. Patient is in no acute distress.  Skin: Skin is warm and dry. No rash noted.   Cardiovascular: Normal heart rate noted  Respiratory: Normal respiratory effort, no problems with respiration noted  Abdomen: Soft, gravid, appropriate for gestational age.  Pain/Pressure: Present     Pelvic: Cervical exam deferred        Extremities: Normal range of motion.  Edema: None  Mental Status:  Normal mood and affect. Normal behavior. Normal judgment and thought content.   Assessment and Plan:  Pregnancy: G2P0010 at 1673w1d  1. Supervision of other normal pregnancy, antepartum - 28 week labs reviewed, wnl, other than anemia  2. Anemia affecting pregnancy in third trimester Hgb 8.6 on 11/29. Ferritin was 11 on 02/07/17. Pt not taking ferrous sulfate on a daily basis - Dicussed with patient importance of compliance with ferous sulfate. Advised to take with Vitamin C to increase absorption, and take colace BID to avoid constipation. Also counseled on iron-rich food, and  avoiding iron-binding foods or tea.  - Would recheck Hgb around 35 weeks  3. Sickle cell trait (HCC) - Urine cx q trimester. Last urine negative on 11/8  Preterm labor symptoms and general obstetric precautions including but not limited to vaginal bleeding, contractions, leaking of fluid and fetal movement were reviewed in detail with the patient. Please refer to After Visit Summary for other counseling recommendations.  Return in about 2 weeks (around 05/21/2017) for LOB.   Frederik PearJulie P Forrest Demuro, MD  Future Appointments  Date Time Provider Department Center  05/22/2017 10:20 AM Marylene LandKooistra, Kathryn Lorraine, CNM [facility]

## 2017-05-22 ENCOUNTER — Ambulatory Visit (INDEPENDENT_AMBULATORY_CARE_PROVIDER_SITE_OTHER): Payer: Medicaid Other | Admitting: Student

## 2017-05-22 VITALS — BP 112/59 | HR 93 | Wt 128.5 lb

## 2017-05-22 DIAGNOSIS — Z3480 Encounter for supervision of other normal pregnancy, unspecified trimester: Secondary | ICD-10-CM

## 2017-05-22 DIAGNOSIS — O99013 Anemia complicating pregnancy, third trimester: Secondary | ICD-10-CM

## 2017-05-22 NOTE — Progress Notes (Signed)
   PRENATAL VISIT NOTE  Subjective:  Megan Strong is a 21 y.o. G2P0010 at 1727w1d being seen today for ongoing prenatal care.  She is currently monitored for the following issues for this low-risk pregnancy and has Supervision of other normal pregnancy, antepartum; Sickle cell trait (HCC); and Anemia affecting pregnancy on their problem list.  Patient reports no complaints.  Contractions: Irritability. Vag. Bleeding: None.  Movement: Present. Denies leaking of fluid.   The following portions of the patient's history were reviewed and updated as appropriate: allergies, current medications, past family history, past medical history, past social history, past surgical history and problem list. Problem list updated.  Objective:   Vitals:   05/22/17 1038  BP: (!) 112/59  Pulse: 93  Weight: 128 lb 8 oz (58.3 kg)    Fetal Status: Fetal Heart Rate (bpm): 142 Fundal Height: 34 cm Movement: Present     General:  Alert, oriented and cooperative. Patient is in no acute distress.  Skin: Skin is warm and dry. No rash noted.   Cardiovascular: Normal heart rate noted  Respiratory: Normal respiratory effort, no problems with respiration noted  Abdomen: Soft, gravid, appropriate for gestational age.  Pain/Pressure: Present     Pelvic: Cervical exam deferred        Extremities: Normal range of motion.  Edema: Trace  Mental Status:  Normal mood and affect. Normal behavior. Normal judgment and thought content.   Assessment and Plan:  Pregnancy: G2P0010 at 4927w1d  1. Supervision of other normal pregnancy, antepartum -No urinary symptoms today - Culture, OB Urine  2. Anemia affecting pregnancy in third trimester Patient is taking iron 2-3 times a day; denies constipation.   Preterm labor symptoms and general obstetric precautions including but not limited to vaginal bleeding, contractions, leaking of fluid and fetal movement were reviewed in detail with the patient. Please refer to After Visit  Summary for other counseling recommendations.  Return in about 2 weeks (around 06/05/2017).   Marylene LandKathryn Lorraine Leydi Winstead, CNM

## 2017-05-22 NOTE — Progress Notes (Signed)
nexplanon

## 2017-05-22 NOTE — Patient Instructions (Signed)
Etonogestrel implant What is this medicine? ETONOGESTREL (et oh noe JES trel) is a contraceptive (birth control) device. It is used to prevent pregnancy. It can be used for up to 3 years. This medicine may be used for other purposes; ask your health care provider or pharmacist if you have questions. COMMON BRAND NAME(S): Implanon, Nexplanon What should I tell my health care provider before I take this medicine? They need to know if you have any of these conditions: -abnormal vaginal bleeding -blood vessel disease or blood clots -cancer of the breast, cervix, or liver -depression -diabetes -gallbladder disease -headaches -heart disease or recent heart attack -high blood pressure -high cholesterol -kidney disease -liver disease -renal disease -seizures -tobacco smoker -an unusual or allergic reaction to etonogestrel, other hormones, anesthetics or antiseptics, medicines, foods, dyes, or preservatives -pregnant or trying to get pregnant -breast-feeding How should I use this medicine? This device is inserted just under the skin on the inner side of your upper arm by a health care professional. Talk to your pediatrician regarding the use of this medicine in children. Special care may be needed. Overdosage: If you think you have taken too much of this medicine contact a poison control center or emergency room at once. NOTE: This medicine is only for you. Do not share this medicine with others. What if I miss a dose? This does not apply. What may interact with this medicine? Do not take this medicine with any of the following medications: -amprenavir -bosentan -fosamprenavir This medicine may also interact with the following medications: -barbiturate medicines for inducing sleep or treating seizures -certain medicines for fungal infections like ketoconazole and itraconazole -grapefruit juice -griseofulvin -medicines to treat seizures like carbamazepine, felbamate, oxcarbazepine,  phenytoin, topiramate -modafinil -phenylbutazone -rifampin -rufinamide -some medicines to treat HIV infection like atazanavir, indinavir, lopinavir, nelfinavir, tipranavir, ritonavir -St. John's wort This list may not describe all possible interactions. Give your health care provider a list of all the medicines, herbs, non-prescription drugs, or dietary supplements you use. Also tell them if you smoke, drink alcohol, or use illegal drugs. Some items may interact with your medicine. What should I watch for while using this medicine? This product does not protect you against HIV infection (AIDS) or other sexually transmitted diseases. You should be able to feel the implant by pressing your fingertips over the skin where it was inserted. Contact your doctor if you cannot feel the implant, and use a non-hormonal birth control method (such as condoms) until your doctor confirms that the implant is in place. If you feel that the implant may have broken or become bent while in your arm, contact your healthcare provider. What side effects may I notice from receiving this medicine? Side effects that you should report to your doctor or health care professional as soon as possible: -allergic reactions like skin rash, itching or hives, swelling of the face, lips, or tongue -breast lumps -changes in emotions or moods -depressed mood -heavy or prolonged menstrual bleeding -pain, irritation, swelling, or bruising at the insertion site -scar at site of insertion -signs of infection at the insertion site such as fever, and skin redness, pain or discharge -signs of pregnancy -signs and symptoms of a blood clot such as breathing problems; changes in vision; chest pain; severe, sudden headache; pain, swelling, warmth in the leg; trouble speaking; sudden numbness or weakness of the face, arm or leg -signs and symptoms of liver injury like dark yellow or brown urine; general ill feeling or flu-like symptoms;  light-colored   stools; loss of appetite; nausea; right upper belly pain; unusually weak or tired; yellowing of the eyes or skin -unusual vaginal bleeding, discharge -signs and symptoms of a stroke like changes in vision; confusion; trouble speaking or understanding; severe headaches; sudden numbness or weakness of the face, arm or leg; trouble walking; dizziness; loss of balance or coordination Side effects that usually do not require medical attention (report to your doctor or health care professional if they continue or are bothersome): -acne -back pain -breast pain -changes in weight -dizziness -general ill feeling or flu-like symptoms -headache -irregular menstrual bleeding -nausea -sore throat -vaginal irritation or inflammation This list may not describe all possible side effects. Call your doctor for medical advice about side effects. You may report side effects to FDA at 1-800-FDA-1088. Where should I keep my medicine? This drug is given in a hospital or clinic and will not be stored at home. NOTE: This sheet is a summary. It may not cover all possible information. If you have questions about this medicine, talk to your doctor, pharmacist, or health care provider.  2018 Elsevier/Gold Standard (2015-11-30 11:19:22)  

## 2017-05-24 LAB — CULTURE, OB URINE

## 2017-05-24 LAB — URINE CULTURE, OB REFLEX

## 2017-06-05 ENCOUNTER — Ambulatory Visit (INDEPENDENT_AMBULATORY_CARE_PROVIDER_SITE_OTHER): Payer: Medicaid Other | Admitting: Student

## 2017-06-05 VITALS — BP 108/65 | HR 79 | Wt 131.0 lb

## 2017-06-05 DIAGNOSIS — Z3480 Encounter for supervision of other normal pregnancy, unspecified trimester: Secondary | ICD-10-CM

## 2017-06-05 DIAGNOSIS — O26843 Uterine size-date discrepancy, third trimester: Secondary | ICD-10-CM

## 2017-06-05 DIAGNOSIS — O99013 Anemia complicating pregnancy, third trimester: Secondary | ICD-10-CM

## 2017-06-05 NOTE — Patient Instructions (Signed)

## 2017-06-05 NOTE — Progress Notes (Signed)
Patient ID: Megan Strong, female   DOB: 10/22/1995, 22 y.o.   MRN: [id number]   PRENATAL VISIT NOTE  Subjective:  Megan Strong is a 22 y.o. G2P0010 at 324w1d being seen today for ongoing prenatal care.  She is currently monitored for the following issues for this low-risk pregnancy and has Supervision of other normal pregnancy, antepartum; Sickle cell trait (HCC); and Anemia affecting pregnancy on their problem list.  Patient reports backache in the middle of her lower back. She felt some Braxton hicks contractions last week too. She wants her cervix checked.  Contractions: Irritability. Vag. Bleeding: None.  Movement: Present. Denies leaking of fluid.   The following portions of the patient's history were reviewed and updated as appropriate: allergies, current medications, past family history, past medical history, past social history, past surgical history and problem list. Problem list updated.  Objective:   Vitals:   06/05/17 1427  BP: 108/65  Pulse: 79  Weight: 131 lb (59.4 kg)    Fetal Status: Fetal Heart Rate (bpm): 148 Fundal Height: 30 cm Movement: Present     General:  Alert, oriented and cooperative. Patient is in no acute distress.  Skin: Skin is warm and dry. No rash noted.   Cardiovascular: Normal heart rate noted  Respiratory: Normal respiratory effort, no problems with respiration noted  Abdomen: Soft, gravid, appropriate for gestational age.  Pain/Pressure: Present     Pelvic: Cervical exam performed      Cervix is closed and posterior.   Extremities: Normal range of motion.  Edema: Trace  Mental Status:  Normal mood and affect. Normal behavior. Normal judgment and thought content.   Assessment and Plan:  Pregnancy: G2P0010 at 4524w1d  1. Anemia affecting pregnancy in third trimester -still taking iron; tolerating it well.  - CBC - US MFM OB FOLLOW UP; Future 2. Supervision of normal pregnancy -explained to patient that most likely growth is normal but we want  to be sure so US is most prudent.  -Reviewed warning signs of labor and when to come to the [facility].   Preterm labor symptoms and general obstetric precautions including but not limited to vaginal bleeding, contractions, leaking of fluid and fetal movement were reviewed in detail with the patient. Please refer to After Visit Summary for other counseling recommendations.  Return in about 1 week (around 06/12/2017), or LROB.   Marylene LandKathryn Lorraine Kooistra, CNM

## 2017-06-05 NOTE — Progress Notes (Signed)
Pt stated having lower back sharp pain..Marland Kitchen

## 2017-06-06 LAB — CBC
HEMOGLOBIN: 9.5 g/dL — AB (ref 11.1–15.9)
Hematocrit: 29 % — ABNORMAL LOW (ref 34.0–46.6)
MCH: 27.8 pg (ref 26.6–33.0)
MCHC: 32.8 g/dL (ref 31.5–35.7)
MCV: 85 fL (ref 79–97)
Platelets: 210 10*3/uL (ref 150–379)
RBC: 3.42 x10E6/uL — AB (ref 3.77–5.28)
RDW: 17.7 % — ABNORMAL HIGH (ref 12.3–15.4)
WBC: 9.7 10*3/uL (ref 3.4–10.8)

## 2017-06-11 ENCOUNTER — Ambulatory Visit (HOSPITAL_COMMUNITY)
Admission: RE | Admit: 2017-06-11 | Discharge: 2017-06-11 | Disposition: A | Discharge status: Home / Self Care | Payer: Medicaid Other | Source: Ambulatory Visit | Attending: Student | Admitting: Student

## 2017-06-11 DIAGNOSIS — O09893 Supervision of other high risk pregnancies, third trimester: Secondary | ICD-10-CM | POA: Insufficient documentation

## 2017-06-11 DIAGNOSIS — Z3A36 36 weeks gestation of pregnancy: Secondary | ICD-10-CM | POA: Insufficient documentation

## 2017-06-11 DIAGNOSIS — O99013 Anemia complicating pregnancy, third trimester: Secondary | ICD-10-CM | POA: Insufficient documentation

## 2017-06-11 DIAGNOSIS — Z362 Encounter for other antenatal screening follow-up: Secondary | ICD-10-CM | POA: Insufficient documentation

## 2017-06-12 ENCOUNTER — Other Ambulatory Visit (HOSPITAL_COMMUNITY)
Admission: RE | Admit: 2017-06-12 | Discharge: 2017-06-12 | Disposition: A | Discharge status: Home / Self Care | Payer: Medicaid Other | Source: Ambulatory Visit | Attending: Student | Admitting: Student

## 2017-06-12 ENCOUNTER — Ambulatory Visit (INDEPENDENT_AMBULATORY_CARE_PROVIDER_SITE_OTHER): Payer: Medicaid Other | Admitting: Student

## 2017-06-12 VITALS — BP 105/66 | HR 93 | Wt 129.9 lb

## 2017-06-12 DIAGNOSIS — Z3480 Encounter for supervision of other normal pregnancy, unspecified trimester: Secondary | ICD-10-CM | POA: Diagnosis present

## 2017-06-12 DIAGNOSIS — Z3483 Encounter for supervision of other normal pregnancy, third trimester: Secondary | ICD-10-CM | POA: Diagnosis not present

## 2017-06-12 DIAGNOSIS — D649 Anemia, unspecified: Secondary | ICD-10-CM

## 2017-06-12 DIAGNOSIS — O99013 Anemia complicating pregnancy, third trimester: Secondary | ICD-10-CM

## 2017-06-12 DIAGNOSIS — O26843 Uterine size-date discrepancy, third trimester: Secondary | ICD-10-CM

## 2017-06-12 NOTE — Patient Instructions (Signed)
AREA PEDIATRIC/FAMILY PRACTICE PHYSICIANS  [facility] [address], [address] [place], [place]  [address] Phone - [phone]   Fax - [phone]  [facility] [address] [place], [place] [address] Phone - [phone]   Fax - [phone]  JACK AMOS [address] [place], [place]  [address] Phone - [phone]   Fax - [phone]  [facility] [address] [place], [place]  [address] Phone - [phone]   Fax - [phone]  [facility] [address] [place], [place]  [address] Phone - [phone]   Fax - [phone]  [facility] [address] [place], [place]  [address] Phone - [phone]   Fax - [phone]  [facility] [address] [place], [place]  [address] Phone - [phone]   Fax - [phone]  [facility] [address], [address] [place], [place]  [address] Phone - [phone]   Fax - [phone]  [facility] [address] [place], [place]  [address] Phone - [phone]   Fax - [phone] [facility] [address] [place], [place]  [address] Phone - [phone]   Fax - [phone]  [facility] [address] [place], [place]  [address] Phone - [phone]   Fax - [phone]  [facility] [address] [place], [place]  [address] Phone - [phone]   Fax - [phone]  [facility] [address] [place], [place]  [address] Phone - [phone]   Fax - [phone]  SHILPA GOSRANI [address] [place], [place]  [address] Phone - [phone]  [facility] [address] [place], [place]  [address] Phone - [phone]   Fax - [phone]  [facility] [address] [place], [place]  [address] Phone - [phone]   Fax - [phone]  [facility] [address] [place], [place]  [address] Phone - [phone]   Fax - [phone]  [facility] [address] [place], [place]  [address] Phone - [phone]   Fax - [phone]   [facility] [address] [place], [place]  [address] Phone - [phone]   Fax - [phone]  [facility] [address] [place], [place]  [address] Phone - [phone]   Fax - [phone]  DAVID RUBIN [address] [place], [place]  [address] Phone - [phone]   Fax - [phone]  [facility] [address] [place], [place]  [address] Phone - [phone]   Fax - [phone]  [facility] [address] , [place]  [address] Phone - [phone]   Fax - [phone] [facility] [address] [place], [place]  [address] Phone - [phone]   Fax - [phone]  [facility] [address] [place], [place]  [address] Phone - [phone]   Fax - [phone]  [facility] [address] [place], [place]  [address] Phone - [phone]   Fax - [phone]  [facility] Charlene Flemming MD [address] [place]  [address] Phone [phone]  Fax [phone]   

## 2017-06-13 LAB — GC/CHLAMYDIA PROBE AMP (~~LOC~~) NOT AT ARMC
Chlamydia: NEGATIVE
Neisseria Gonorrhea: NEGATIVE

## 2017-06-13 NOTE — Progress Notes (Signed)
   PRENATAL VISIT NOTE  Subjective:  Fredric Dineshley Ridley is a 22 y.o. G2P0010 at 150w2d being seen today for ongoing prenatal care.  She is currently monitored for the following issues for this low-risk pregnancy and has Supervision of other normal pregnancy, antepartum; Sickle cell trait (HCC); Anemia affecting pregnancy; and Uterine size date discrepancy pregnancy, third trimester on their problem list.  Patient reports no complaints.  Contractions: Irritability. Vag. Bleeding: None.  Movement: Present. Denies leaking of fluid.   The following portions of the patient's history were reviewed and updated as appropriate: allergies, current medications, past family history, past medical history, past social history, past surgical history and problem list. Problem list updated.  Objective:   Vitals:   06/12/17 1007  BP: 105/66  Pulse: 93  Weight: 129 lb 14.4 oz (58.9 kg)    Fetal Status: Fetal Heart Rate (bpm): 132 Fundal Height: 34 cm Movement: Present  Presentation: Vertex  General:  Alert, oriented and cooperative. Patient is in no acute distress.  Skin: Skin is warm and dry. No rash noted.   Cardiovascular: Normal heart rate noted  Respiratory: Normal respiratory effort, no problems with respiration noted  Abdomen: Soft, gravid, appropriate for gestational age.  Pain/Pressure: Present     Pelvic: Cervical exam deferred        Extremities: Normal range of motion.  Edema: Trace  Mental Status:  Normal mood and affect. Normal behavior. Normal judgment and thought content.   Assessment and Plan:  Pregnancy: G2P0010 at [redacted]w[redacted]d  1. Supervision of other normal pregnancy, antepartum  - GC/Chlamydia probe amp ([place] - Culture, beta strep (group b only)  2. Uterine size date discrepancy pregnancy, third trimester FH normal today; last US shows appropriate fetal growth.   3. Anemia affecting pregnancy in third trimester Taking iron without problems   Term labor symptoms  and general obstetric precautions including but not limited to vaginal bleeding, contractions, leaking of fluid and fetal movement were reviewed in detail with the patient. Please refer to After Visit Summary for other counseling recommendations.  Return in about 1 week (around 06/19/2017), or LROB.   Marylene LandKathryn Lorraine Kooistra, CNM

## 2017-06-16 LAB — CULTURE, BETA STREP (GROUP B ONLY): Strep Gp B Culture: NEGATIVE

## 2017-06-19 ENCOUNTER — Ambulatory Visit (INDEPENDENT_AMBULATORY_CARE_PROVIDER_SITE_OTHER): Payer: Medicaid Other | Admitting: Student

## 2017-06-19 ENCOUNTER — Encounter: Payer: Self-pay | Admitting: Student

## 2017-06-19 DIAGNOSIS — Z3480 Encounter for supervision of other normal pregnancy, unspecified trimester: Secondary | ICD-10-CM

## 2017-06-19 NOTE — Patient Instructions (Signed)
Vaginal Delivery Vaginal delivery means that you will give birth by pushing your baby out of your birth canal (vagina). A team of health care providers will help you before, during, and after vaginal delivery. Birth experiences are unique for every woman and every pregnancy, and birth experiences vary depending on where you choose to give birth. What should I do to prepare for my baby's birth? Before your baby is born, it is important to talk with your health care provider about:  Your labor and delivery preferences. These may include: ? Medicines that you may be given. ? How you will manage your pain. This might include non-medical pain relief techniques or injectable pain relief such as epidural analgesia. ? How you and your baby will be monitored during labor and delivery. ? Who may be in the labor and delivery room with you. ? Your feelings about surgical delivery of your baby (cesarean delivery, or C-section) if this becomes necessary. ? Your feelings about receiving donated blood through an IV tube (blood transfusion) if this becomes necessary.  Whether you are able: ? To take pictures or videos of the birth. ? To eat during labor and delivery. ? To move around, walk, or change positions during labor and delivery.  What to expect after your baby is born, such as: ? Whether delayed umbilical cord clamping and cutting is offered. ? Who will care for your baby right after birth. ? Medicines or tests that may be recommended for your baby. ? Whether breastfeeding is supported in your hospital or birth center. ? How long you will be in the hospital or birth center.  How any medical conditions you have may affect your baby or your labor and delivery experience.  To prepare for your baby's birth, you should also:  Attend all of your health care visits before delivery (prenatal visits) as recommended by your health care provider. This is important.  Prepare your home for your baby's  arrival. Make sure that you have: ? Diapers. ? Baby clothing. ? Feeding equipment. ? Safe sleeping arrangements for you and your baby.  Install a car seat in your vehicle. Have your car seat checked by a certified car seat installer to make sure that it is installed safely.  Think about who will help you with your new baby at home for at least the first several weeks after delivery.  What can I expect when I arrive at the birth center or hospital? Once you are in labor and have been admitted into the hospital or birth center, your health care provider may:  Review your pregnancy history and any concerns you have.  Insert an IV tube into one of your veins. This is used to give you fluids and medicines.  Check your blood pressure, pulse, temperature, and heart rate (vital signs).  Check whether your bag of water (amniotic sac) has broken (ruptured).  Talk with you about your birth plan and discuss pain control options.  Monitoring Your health care provider may monitor your contractions (uterine monitoring) and your baby's heart rate (fetal monitoring). You may need to be monitored:  Often, but not continuously (intermittently).  All the time or for long periods at a time (continuously). Continuous monitoring may be needed if: ? You are taking certain medicines, such as medicine to relieve pain or make your contractions stronger. ? You have pregnancy or labor complications.  Monitoring may be done by:  Placing a special stethoscope or a handheld monitoring device on your abdomen to   check your baby's heartbeat, and feeling your abdomen for contractions. This method of monitoring does not continuously record your baby's heartbeat or your contractions.  Placing monitors on your abdomen (external monitors) to record your baby's heartbeat and the frequency and length of contractions. You may not have to wear external monitors all the time.  Placing monitors inside of your uterus  (internal monitors) to record your baby's heartbeat and the frequency, length, and strength of your contractions. ? Your health care provider may use internal monitors if he or she needs more information about the strength of your contractions or your baby's heart rate. ? Internal monitors are put in place by passing a thin, flexible wire through your vagina and into your uterus. Depending on the type of monitor, it may remain in your uterus or on your baby's head until birth. ? Your health care provider will discuss the benefits and risks of internal monitoring with you and will ask for your permission before inserting the monitors.  Telemetry. This is a type of continuous monitoring that can be done with external or internal monitors. Instead of having to stay in bed, you are able to move around during telemetry. Ask your health care provider if telemetry is an option for you.  Physical exam Your health care provider may perform a physical exam. This may include:  Checking whether your baby is positioned: ? With the head toward your vagina (head-down). This is most common. ? With the head toward the top of your uterus (head-up or breech). If your baby is in a breech position, your health care provider may try to turn your baby to a head-down position so you can deliver vaginally. If it does not seem that your baby can be born vaginally, your provider may recommend surgery to deliver your baby. In rare cases, you may be able to deliver vaginally if your baby is head-up (breech delivery). ? Lying sideways (transverse). Babies that are lying sideways cannot be delivered vaginally.  Checking your cervix to determine: ? Whether it is thinning out (effacing). ? Whether it is opening up (dilating). ? How low your baby has moved into your birth canal.  What are the three stages of labor and delivery?  Normal labor and delivery is divided into the following three stages: Stage 1  Stage 1 is the  longest stage of labor, and it can last for hours or days. Stage 1 includes: ? Early labor. This is when contractions may be irregular, or regular and mild. Generally, early labor contractions are more than 10 minutes apart. ? Active labor. This is when contractions get longer, more regular, more frequent, and more intense. ? The transition phase. This is when contractions happen very close together, are very intense, and may last longer than during any other part of labor.  Contractions generally feel mild, infrequent, and irregular at first. They get stronger, more frequent (about every 2-3 minutes), and more regular as you progress from early labor through active labor and transition.  Many women progress through stage 1 naturally, but you may need help to continue making progress. If this happens, your health care provider may talk with you about: ? Rupturing your amniotic sac if it has not ruptured yet. ? Giving you medicine to help make your contractions stronger and more frequent.  Stage 1 ends when your cervix is completely dilated to 4 inches (10 cm) and completely effaced. This happens at the end of the transition phase. Stage 2  Once   your cervix is completely effaced and dilated to 4 inches (10 cm), you may start to feel an urge to push. It is common for the body to naturally take a rest before feeling the urge to push, especially if you received an epidural or certain other pain medicines. This rest period may last for up to 1-2 hours, depending on your unique labor experience.  During stage 2, contractions are generally less painful, because pushing helps relieve contraction pain. Instead of contraction pain, you may feel stretching and burning pain, especially when the widest part of your baby's head passes through the vaginal opening (crowning).  Your health care provider will closely monitor your pushing progress and your baby's progress through the vagina during stage 2.  Your  health care provider may massage the area of skin between your vaginal opening and anus (perineum) or apply warm compresses to your perineum. This helps it stretch as the baby's head starts to crown, which can help prevent perineal tearing. ? In some cases, an incision may be made in your perineum (episiotomy) to allow the baby to pass through the vaginal opening. An episiotomy helps to make the opening of the vagina larger to allow more room for the baby to fit through.  It is very important to breathe and focus so your health care provider can control the delivery of your baby's head. Your health care provider may have you decrease the intensity of your pushing, to help prevent perineal tearing.  After delivery of your baby's head, the shoulders and the rest of the body generally deliver very quickly and without difficulty.  Once your baby is delivered, the umbilical cord may be cut right away, or this may be delayed for 1-2 minutes, depending on your baby's health. This may vary among health care providers, hospitals, and birth centers.  If you and your baby are healthy enough, your baby may be placed on your chest or abdomen to help maintain the baby's temperature and to help you bond with each other. Some mothers and babies start breastfeeding at this time. Your health care team will dry your baby and help keep your baby warm during this time.  Your baby may need immediate care if he or she: ? Showed signs of distress during labor. ? Has a medical condition. ? Was born too early (prematurely). ? Had a bowel movement before birth (meconium). ? Shows signs of difficulty transitioning from being inside the uterus to being outside of the uterus. If you are planning to breastfeed, your health care team will help you begin a feeding. Stage 3  The third stage of labor starts immediately after the birth of your baby and ends after you deliver the placenta. The placenta is an organ that develops  during pregnancy to provide oxygen and nutrients to your baby in the womb.  Delivering the placenta may require some pushing, and you may have mild contractions. Breastfeeding can stimulate contractions to help you deliver the placenta.  After the placenta is delivered, your uterus should tighten (contract) and become firm. This helps to stop bleeding in your uterus. To help your uterus contract and to control bleeding, your health care provider may: ? Give you medicine by injection, through an IV tube, by mouth, or through your rectum (rectally). ? Massage your abdomen or perform a vaginal exam to remove any blood clots that are left in your uterus. ? Empty your bladder by placing a thin, flexible tube (catheter) into your bladder. ? Encourage   you to breastfeed your baby. After labor is over, you and your baby will be monitored closely to ensure that you are both healthy until you are ready to go home. Your health care team will teach you how to care for yourself and your baby. This information is not intended to replace advice given to you by your health care provider. Make sure you discuss any questions you have with your health care provider. Document Released: 02/20/2008 Document Revised: 12/01/2015 Document Reviewed: 05/28/2015 Elsevier Interactive Patient Education  2018 Elsevier Inc.  

## 2017-06-19 NOTE — Progress Notes (Signed)
   PRENATAL VISIT NOTE  Subjective:  Megan Strong is a 22 y.o. G2P0010 at 466w1d being seen today for ongoing prenatal care.  She is currently monitored for the following issues for this low-risk pregnancy and has Supervision of other normal pregnancy, antepartum; Sickle cell trait (HCC); Anemia affecting pregnancy; and Uterine size date discrepancy pregnancy, third trimester on their problem list.  Patient reports backache.  Contractions: Irregular. Vag. Bleeding: None.  Movement: Present. Denies leaking of fluid.   The following portions of the patient's history were reviewed and updated as appropriate: allergies, current medications, past family history, past medical history, past social history, past surgical history and problem list. Problem list updated.  Objective:   Vitals:   06/19/17 1125  BP: 109/71  Pulse: 92  Weight: 132 lb 6.4 oz (60.1 kg)    Fetal Status: Fetal Heart Rate (bpm): 141 Fundal Height: 35 cm Movement: Present     General:  Alert, oriented and cooperative. Patient is in no acute distress.  Skin: Skin is warm and dry. No rash noted.   Cardiovascular: Normal heart rate noted  Respiratory: Normal respiratory effort, no problems with respiration noted  Abdomen: Soft, gravid, appropriate for gestational age.  Pain/Pressure: Present     Pelvic: Cervical exam deferred        Extremities: Normal range of motion.  Edema: Trace  Mental Status:  Normal mood and affect. Normal behavior. Normal judgment and thought content.   Assessment and Plan:  Pregnancy: G2P0010 at 6766w1d  1. Supervision of other normal pregnancy, antepartum No significant complaints at this time.  Fundal height was measured at 35 cm which was low normal. She has had low measurements intermittently throughout pregnancy.  We will consider getting another US to visualize fetal growth at next visit if fundal height does not increase.    Term labor symptoms and general obstetric precautions including  but not limited to vaginal bleeding, contractions, leaking of fluid and fetal movement were reviewed in detail with the patient. Please refer to After Visit Summary for other counseling recommendations.  Return in about 1 week (around 06/26/2017), or ROB.   Ames Coupeharles A Connor Meacham, Medical Student   I confirm that I have verified the information documented in the student's note and that I have also personally reperformed the physical exam and all medical decision making activities.   Luna KitchensKathryn Kooistra CNM

## 2017-06-26 ENCOUNTER — Ambulatory Visit (INDEPENDENT_AMBULATORY_CARE_PROVIDER_SITE_OTHER): Payer: Medicaid Other | Admitting: Student

## 2017-06-26 VITALS — BP 109/73 | HR 88 | Wt 132.6 lb

## 2017-06-26 DIAGNOSIS — Z3493 Encounter for supervision of normal pregnancy, unspecified, third trimester: Secondary | ICD-10-CM

## 2017-06-26 NOTE — Progress Notes (Signed)
   PRENATAL VISIT NOTE  Subjective:  Megan Strong is a 22 y.o. G2P0010 at 2086w1d being seen today for ongoing prenatal care.  She is currently monitored for the following issues for this low-risk pregnancy and has Encounter for supervision of normal pregnancy in third trimester; Sickle cell trait (HCC); Anemia affecting pregnancy; and Uterine size date discrepancy pregnancy, third trimester on their problem list.  Patient reports no complaints.  Contractions: Irritability. Vag. Bleeding: None.  Movement: Present. Denies leaking of fluid.   The following portions of the patient's history were reviewed and updated as appropriate: allergies, current medications, past family history, past medical history, past social history, past surgical history and problem list. Problem list updated.  Objective:   Vitals:   06/26/17 0922  BP: 109/73  Pulse: 88  Weight: 132 lb 9.6 oz (60.1 kg)    Fetal Status: Fetal Heart Rate (bpm): 142 Fundal Height: 37 cm Movement: Present     General:  Alert, oriented and cooperative. Patient is in no acute distress.  Skin: Skin is warm and dry. No rash noted.   Cardiovascular: Normal heart rate noted  Respiratory: Normal respiratory effort, no problems with respiration noted  Abdomen: Soft, gravid, appropriate for gestational age.  Pain/Pressure: Present     Pelvic: Cervical exam deferred        Extremities: Normal range of motion.  Edema: Trace  Mental Status:  Normal mood and affect. Normal behavior. Normal judgment and thought content.   Assessment and Plan:  Pregnancy: G2P0010 at 8386w1d  1. Encounter for supervision of normal pregnancy in third trimester, unspecified gravidity Doing well; is planning to call pediatrician's offices today.   Term labor symptoms and general obstetric precautions including but not limited to vaginal bleeding, contractions, leaking of fluid and fetal movement were reviewed in detail with the patient. Please refer to After Visit  Summary for other counseling recommendations.  Return in about 1 week (around 07/03/2017).   Marylene LandKathryn Lorraine Kooistra, CNM

## 2017-06-26 NOTE — Patient Instructions (Signed)
Vaginal Delivery Vaginal delivery means that you will give birth by pushing your baby out of your birth canal (vagina). A team of health care providers will help you before, during, and after vaginal delivery. Birth experiences are unique for every woman and every pregnancy, and birth experiences vary depending on where you choose to give birth. What should I do to prepare for my baby's birth? Before your baby is born, it is important to talk with your health care provider about:  Your labor and delivery preferences. These may include: ? Medicines that you may be given. ? How you will manage your pain. This might include non-medical pain relief techniques or injectable pain relief such as epidural analgesia. ? How you and your baby will be monitored during labor and delivery. ? Who may be in the labor and delivery room with you. ? Your feelings about surgical delivery of your baby (cesarean delivery, or C-section) if this becomes necessary. ? Your feelings about receiving donated blood through an IV tube (blood transfusion) if this becomes necessary.  Whether you are able: ? To take pictures or videos of the birth. ? To eat during labor and delivery. ? To move around, walk, or change positions during labor and delivery.  What to expect after your baby is born, such as: ? Whether delayed umbilical cord clamping and cutting is offered. ? Who will care for your baby right after birth. ? Medicines or tests that may be recommended for your baby. ? Whether breastfeeding is supported in your hospital or birth center. ? How long you will be in the hospital or birth center.  How any medical conditions you have may affect your baby or your labor and delivery experience.  To prepare for your baby's birth, you should also:  Attend all of your health care visits before delivery (prenatal visits) as recommended by your health care provider. This is important.  Prepare your home for your baby's  arrival. Make sure that you have: ? Diapers. ? Baby clothing. ? Feeding equipment. ? Safe sleeping arrangements for you and your baby.  Install a car seat in your vehicle. Have your car seat checked by a certified car seat installer to make sure that it is installed safely.  Think about who will help you with your new baby at home for at least the first several weeks after delivery.  What can I expect when I arrive at the birth center or hospital? Once you are in labor and have been admitted into the hospital or birth center, your health care provider may:  Review your pregnancy history and any concerns you have.  Insert an IV tube into one of your veins. This is used to give you fluids and medicines.  Check your blood pressure, pulse, temperature, and heart rate (vital signs).  Check whether your bag of water (amniotic sac) has broken (ruptured).  Talk with you about your birth plan and discuss pain control options.  Monitoring Your health care provider may monitor your contractions (uterine monitoring) and your baby's heart rate (fetal monitoring). You may need to be monitored:  Often, but not continuously (intermittently).  All the time or for long periods at a time (continuously). Continuous monitoring may be needed if: ? You are taking certain medicines, such as medicine to relieve pain or make your contractions stronger. ? You have pregnancy or labor complications.  Monitoring may be done by:  Placing a special stethoscope or a handheld monitoring device on your abdomen to   check your baby's heartbeat, and feeling your abdomen for contractions. This method of monitoring does not continuously record your baby's heartbeat or your contractions.  Placing monitors on your abdomen (external monitors) to record your baby's heartbeat and the frequency and length of contractions. You may not have to wear external monitors all the time.  Placing monitors inside of your uterus  (internal monitors) to record your baby's heartbeat and the frequency, length, and strength of your contractions. ? Your health care provider may use internal monitors if he or she needs more information about the strength of your contractions or your baby's heart rate. ? Internal monitors are put in place by passing a thin, flexible wire through your vagina and into your uterus. Depending on the type of monitor, it may remain in your uterus or on your baby's head until birth. ? Your health care provider will discuss the benefits and risks of internal monitoring with you and will ask for your permission before inserting the monitors.  Telemetry. This is a type of continuous monitoring that can be done with external or internal monitors. Instead of having to stay in bed, you are able to move around during telemetry. Ask your health care provider if telemetry is an option for you.  Physical exam Your health care provider may perform a physical exam. This may include:  Checking whether your baby is positioned: ? With the head toward your vagina (head-down). This is most common. ? With the head toward the top of your uterus (head-up or breech). If your baby is in a breech position, your health care provider may try to turn your baby to a head-down position so you can deliver vaginally. If it does not seem that your baby can be born vaginally, your provider may recommend surgery to deliver your baby. In rare cases, you may be able to deliver vaginally if your baby is head-up (breech delivery). ? Lying sideways (transverse). Babies that are lying sideways cannot be delivered vaginally.  Checking your cervix to determine: ? Whether it is thinning out (effacing). ? Whether it is opening up (dilating). ? How low your baby has moved into your birth canal.  What are the three stages of labor and delivery?  Normal labor and delivery is divided into the following three stages: Stage 1  Stage 1 is the  longest stage of labor, and it can last for hours or days. Stage 1 includes: ? Early labor. This is when contractions may be irregular, or regular and mild. Generally, early labor contractions are more than 10 minutes apart. ? Active labor. This is when contractions get longer, more regular, more frequent, and more intense. ? The transition phase. This is when contractions happen very close together, are very intense, and may last longer than during any other part of labor.  Contractions generally feel mild, infrequent, and irregular at first. They get stronger, more frequent (about every 2-3 minutes), and more regular as you progress from early labor through active labor and transition.  Many women progress through stage 1 naturally, but you may need help to continue making progress. If this happens, your health care provider may talk with you about: ? Rupturing your amniotic sac if it has not ruptured yet. ? Giving you medicine to help make your contractions stronger and more frequent.  Stage 1 ends when your cervix is completely dilated to 4 inches (10 cm) and completely effaced. This happens at the end of the transition phase. Stage 2  Once   your cervix is completely effaced and dilated to 4 inches (10 cm), you may start to feel an urge to push. It is common for the body to naturally take a rest before feeling the urge to push, especially if you received an epidural or certain other pain medicines. This rest period may last for up to 1-2 hours, depending on your unique labor experience.  During stage 2, contractions are generally less painful, because pushing helps relieve contraction pain. Instead of contraction pain, you may feel stretching and burning pain, especially when the widest part of your baby's head passes through the vaginal opening (crowning).  Your health care provider will closely monitor your pushing progress and your baby's progress through the vagina during stage 2.  Your  health care provider may massage the area of skin between your vaginal opening and anus (perineum) or apply warm compresses to your perineum. This helps it stretch as the baby's head starts to crown, which can help prevent perineal tearing. ? In some cases, an incision may be made in your perineum (episiotomy) to allow the baby to pass through the vaginal opening. An episiotomy helps to make the opening of the vagina larger to allow more room for the baby to fit through.  It is very important to breathe and focus so your health care provider can control the delivery of your baby's head. Your health care provider may have you decrease the intensity of your pushing, to help prevent perineal tearing.  After delivery of your baby's head, the shoulders and the rest of the body generally deliver very quickly and without difficulty.  Once your baby is delivered, the umbilical cord may be cut right away, or this may be delayed for 1-2 minutes, depending on your baby's health. This may vary among health care providers, hospitals, and birth centers.  If you and your baby are healthy enough, your baby may be placed on your chest or abdomen to help maintain the baby's temperature and to help you bond with each other. Some mothers and babies start breastfeeding at this time. Your health care team will dry your baby and help keep your baby warm during this time.  Your baby may need immediate care if he or she: ? Showed signs of distress during labor. ? Has a medical condition. ? Was born too early (prematurely). ? Had a bowel movement before birth (meconium). ? Shows signs of difficulty transitioning from being inside the uterus to being outside of the uterus. If you are planning to breastfeed, your health care team will help you begin a feeding. Stage 3  The third stage of labor starts immediately after the birth of your baby and ends after you deliver the placenta. The placenta is an organ that develops  during pregnancy to provide oxygen and nutrients to your baby in the womb.  Delivering the placenta may require some pushing, and you may have mild contractions. Breastfeeding can stimulate contractions to help you deliver the placenta.  After the placenta is delivered, your uterus should tighten (contract) and become firm. This helps to stop bleeding in your uterus. To help your uterus contract and to control bleeding, your health care provider may: ? Give you medicine by injection, through an IV tube, by mouth, or through your rectum (rectally). ? Massage your abdomen or perform a vaginal exam to remove any blood clots that are left in your uterus. ? Empty your bladder by placing a thin, flexible tube (catheter) into your bladder. ? Encourage   you to breastfeed your baby. After labor is over, you and your baby will be monitored closely to ensure that you are both healthy until you are ready to go home. Your health care team will teach you how to care for yourself and your baby. This information is not intended to replace advice given to you by your health care provider. Make sure you discuss any questions you have with your health care provider. Document Released: 02/20/2008 Document Revised: 12/01/2015 Document Reviewed: 05/28/2015 Elsevier Interactive Patient Education  2018 Elsevier Inc.  

## 2017-07-03 ENCOUNTER — Telehealth: Payer: Self-pay | Admitting: Family Medicine

## 2017-07-03 ENCOUNTER — Encounter: Payer: Self-pay | Admitting: Family Medicine

## 2017-07-03 ENCOUNTER — Ambulatory Visit (INDEPENDENT_AMBULATORY_CARE_PROVIDER_SITE_OTHER): Payer: Medicaid Other | Admitting: Student

## 2017-07-03 VITALS — BP 100/57 | HR 81 | Wt 136.1 lb

## 2017-07-03 DIAGNOSIS — Z3403 Encounter for supervision of normal first pregnancy, third trimester: Secondary | ICD-10-CM

## 2017-07-03 NOTE — Progress Notes (Signed)
   PRENATAL VISIT NOTE  Subjective:  Fredric Dineshley Loeber is a 22 y.o. G2P0010 at 6048w1d being seen today for ongoing prenatal care.  She is currently monitored for the following issues for this low-risk pregnancy and has Encounter for supervision of normal pregnancy in third trimester; Sickle cell trait (HCC); Anemia affecting pregnancy; and Uterine size date discrepancy pregnancy, third trimester on their problem list.  Patient reports no complaints.  Contractions: Irregular. Vag. Bleeding: None.  Movement: Present. Denies leaking of fluid.   The following portions of the patient's history were reviewed and updated as appropriate: allergies, current medications, past family history, past medical history, past social history, past surgical history and problem list. Problem list updated.  Objective:   Vitals:   07/03/17 0937  BP: (!) 100/57  Pulse: 81  Weight: 136 lb 1.6 oz (61.7 kg)    Fetal Status: Fetal Heart Rate (bpm): 145 Fundal Height: 38 cm Movement: Present     General:  Alert, oriented and cooperative. Patient is in no acute distress.  Skin: Skin is warm and dry. No rash noted.   Cardiovascular: Normal heart rate noted  Respiratory: Normal respiratory effort, no problems with respiration noted  Abdomen: Soft, gravid, appropriate for gestational age.  Pain/Pressure: Present     Pelvic: Cervical exam deferred        Extremities: Normal range of motion.  Edema: Trace  Mental Status:  Normal mood and affect. Normal behavior. Normal judgment and thought content.   Assessment and Plan:  Pregnancy: G2P0010 at 3448w1d  1. Encounter for supervision of normal first pregnancy in third trimester Doing well; reviewed that next week we will do BPP and NST. Plan for induction at 41 weeks.    Term labor symptoms and general obstetric precautions including but not limited to vaginal bleeding, contractions, leaking of fluid and fetal movement were reviewed in detail with the patient. Please  refer to After Visit Summary for other counseling recommendations.  Return in about 1 week (around 07/10/2017) for LROb and BPP/NST on 2-13,2-14 or 2-15.   Marylene LandKathryn Lorraine Xavia Kniskern, CNM

## 2017-07-03 NOTE — Telephone Encounter (Signed)
Tried calling patient several times to give her her upcoming appt, No answer and I was unable to leave a message, I will send patient a letter about her appts.

## 2017-07-03 NOTE — Patient Instructions (Addendum)

## 2017-07-11 ENCOUNTER — Other Ambulatory Visit: Payer: Medicaid Other

## 2017-07-11 ENCOUNTER — Encounter: Payer: Medicaid Other | Admitting: Student

## 2017-08-14 ENCOUNTER — Ambulatory Visit: Payer: Medicaid Other | Admitting: Family Medicine

## 2017-08-14 ENCOUNTER — Encounter: Payer: Self-pay | Admitting: Student

## 2017-08-14 ENCOUNTER — Ambulatory Visit (INDEPENDENT_AMBULATORY_CARE_PROVIDER_SITE_OTHER): Payer: Medicaid Other | Admitting: Student

## 2017-08-14 DIAGNOSIS — Z1389 Encounter for screening for other disorder: Secondary | ICD-10-CM | POA: Diagnosis not present

## 2017-08-14 NOTE — Patient Instructions (Signed)

## 2017-08-14 NOTE — Progress Notes (Signed)
Subjective:     Megan Strong is a 22 y.o. female who presents for a postpartum visit. She is 6 weeks postpartum following a spontaneous vaginal delivery. She inadvertently delivered in [name] while visiting her mom for the weekend.  I have fully reviewed the prenatal and intrapartum course. The delivery was at 39 gestational weeks. Outcome: spontaneous vaginal delivery. Anesthesia: epidural. Postpartum course has been unevetnful. Baby's course has been unevetnful. Baby is feeding by breast. Bleeding no bleeding. Bowel function is normal. Bladder function is normal. Patient is not sexually active. Contraception method is none. Postpartum depression screening: negative. Review of Systems Pertinent items are noted in HPI.   Objective:    BP 112/90   Pulse 65   Ht 5\' 7"  (1.702 m)   Wt 116 lb 1.6 oz (52.7 kg)   LMP 11/01/2016 Comment: irregular periods  Breastfeeding? Unknown   BMI 18.18 kg/m   General:  alert, cooperative and no distress   Breasts:  inspection negative, no nipple discharge or bleeding, no masses or nodularity palpable  Lungs: clear to auscultation bilaterally  Heart:  regular rate and rhythm, S1, S2 normal, no murmur, click, rub or gallop  Abdomen: soft, non-tender; bowel sounds normal; no masses,  no organomegaly   Vulva:  not evaluated  Vagina: not evaluated  Cervix:  not evaluated.  Corpus: not examined  Adnexa:  not evaluated  Rectal Exam: Not performed.        Assessment:    Healthy postpartum exam. Pap smear not done at today's visit.   Plan:    1. Contraception: none. Considering Mirena, will call back and let us know if she wants it placed.  2. Return for annual exam in 1 year or earlier if she decides to do IUD.  3. Follow up  PRN or for Mirena placement. Pap needed in Fall 2021.

## 2018-02-01 ENCOUNTER — Emergency Department (HOSPITAL_COMMUNITY)
Admission: EM | Admit: 2018-02-01 | Discharge: 2018-02-01 | Disposition: A | Discharge status: Home / Self Care | Payer: Medicaid Other | Attending: Emergency Medicine | Admitting: Emergency Medicine

## 2018-02-01 ENCOUNTER — Other Ambulatory Visit: Payer: Self-pay

## 2018-02-01 DIAGNOSIS — Z79899 Other long term (current) drug therapy: Secondary | ICD-10-CM | POA: Insufficient documentation

## 2018-02-01 DIAGNOSIS — M545 Low back pain: Secondary | ICD-10-CM | POA: Insufficient documentation

## 2018-02-01 DIAGNOSIS — M25511 Pain in right shoulder: Secondary | ICD-10-CM | POA: Insufficient documentation
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
## 2018-02-01 NOTE — Discharge Instructions (Signed)
Please follow up with your primary care provider within 5-7 days for re-evaluation of your symptoms. If you do not have a primary care provider, information for a healthcare clinic has been provided for you to make arrangements for follow up care. Please return to the emergency department for any new or worsening symptoms. ° °

## 2018-07-18 ENCOUNTER — Emergency Department (HOSPITAL_COMMUNITY)
Admission: EM | Admit: 2018-07-18 | Discharge: 2018-07-18 | Disposition: A | Discharge status: Home / Self Care | Payer: Self-pay | Attending: Emergency Medicine | Admitting: Emergency Medicine

## 2018-07-18 ENCOUNTER — Encounter (HOSPITAL_COMMUNITY): Payer: Self-pay | Admitting: Emergency Medicine

## 2018-07-18 ENCOUNTER — Emergency Department (HOSPITAL_COMMUNITY): Payer: Self-pay

## 2018-07-18 DIAGNOSIS — R112 Nausea with vomiting, unspecified: Secondary | ICD-10-CM | POA: Insufficient documentation

## 2018-07-18 DIAGNOSIS — R197 Diarrhea, unspecified: Secondary | ICD-10-CM | POA: Insufficient documentation

## 2018-07-18 LAB — COMPREHENSIVE METABOLIC PANEL
ALT: 13 U/L (ref 0–44)
AST: 17 U/L (ref 15–41)
Albumin: 3.9 g/dL (ref 3.5–5.0)
Alkaline Phosphatase: 52 U/L (ref 38–126)
Anion gap: 7 (ref 5–15)
BUN: 15 mg/dL (ref 6–20)
CO2: 22 mmol/L (ref 22–32)
Calcium: 8.5 mg/dL — ABNORMAL LOW (ref 8.9–10.3)
Chloride: 110 mmol/L (ref 98–111)
Creatinine, Ser: 0.48 mg/dL (ref 0.44–1.00)
GFR calc Af Amer: >60 mL/min (ref >60)
GFR calc non Af Amer: >60 mL/min (ref >60)
Glucose, Bld: 91 mg/dL (ref 70–99)
Potassium: 3.6 mmol/L (ref 3.5–5.1)
Sodium: 139 mmol/L (ref 135–145)
Total Bilirubin: 1 mg/dL (ref 0.3–1.2)
Total Protein: 7.3 g/dL (ref 6.5–8.1)

## 2018-07-18 LAB — CBC
HCT: 37.4 % (ref 36.0–46.0)
HEMOGLOBIN: 12.9 g/dL (ref 12.0–15.0)
MCH: 30.6 pg (ref 26.0–34.0)
MCHC: 34.5 g/dL (ref 30.0–36.0)
MCV: 88.8 fL (ref 80.0–100.0)
Platelets: 225 10*3/uL (ref 150–400)
RBC: 4.21 MIL/uL (ref 3.87–5.11)
RDW: 13.4 % (ref 11.5–15.5)
WBC: 6.9 10*3/uL (ref 4.0–10.5)
nRBC: 0 % (ref 0.0–0.2)

## 2018-07-18 LAB — I-STAT BETA HCG BLOOD, ED (MC, WL, AP ONLY): HCG, QUANTITATIVE: 1026.1 m[IU]/mL — AB (ref <5)

## 2018-07-18 LAB — LIPASE, BLOOD: LIPASE: 41 U/L (ref 11–51)

## 2018-07-18 MED ORDER — ONDANSETRON 4 MG PO TBDP: 4.0000 mg | ORAL_TABLET | Freq: Three times a day (TID) | ORAL | 0 refills | Status: AC | PRN

## 2018-07-18 MED ORDER — ONDANSETRON HCL 4 MG/2ML IJ SOLN
4.0000 mg | Freq: Once | INTRAMUSCULAR | Status: AC
  Administered 2018-07-18: 4 mg via INTRAVENOUS
  Filled 2018-07-18: qty 2

## 2018-07-18 MED ORDER — SODIUM CHLORIDE 0.9 % IV BOLUS
1000.0000 mL | Freq: Once | INTRAVENOUS | Status: AC
  Administered 2018-07-18: 1000 mL via INTRAVENOUS

## 2018-07-18 NOTE — ED Provider Notes (Signed)
[facility] DEPT Provider Note   CSN: [id number] Arrival date & time: 07/18/18  0426    History   Chief Complaint Chief Complaint  Patient presents with  . Emesis  . Diarrhea    HPI Megan Strong is a 23 y.o. female with a PMH of an ovarian cyst presesnting with intermittent nausea, vomiting, and diarrhea onset at 8pm last night. Patient reports she ate chick fil a mac and cheese at 6pm and started to feel symptoms shortly afterwards. Patient states eating makes her symptoms worse and resting makes her symptoms better. Patient denies sick exposures. Patient denies fever, chills, cough, or congestion. Patient reports abdominal cramping with episodes of diarrhea. Patient states abdominal pain has resolved. Patient denies vaginal bleeding, discharge, or dysuria. Patient Reports 4 episodes of non bilious non bloody vomiting with the last episode at 4am today. Patient reports multiple episodes of brown diarrhea and states last episode was at 4am today. Patient reports she had an I&D procedure on 07/10/18 by the [facility]. LMP in 04/2018.    HPI  Past Medical History:  Diagnosis Date  . Ovarian cyst 2017    Patient Active Problem List   Diagnosis Date Noted  . Sickle cell trait ([place]) 02/18/2017    Past Surgical History:  Procedure Laterality Date  . NO PAST SURGERIES    . THERAPEUTIC ABORTION       OB History    Gravida  2   Para  0   Term  0   Preterm  0   AB  1   Living  0     SAB  0   TAB  0   Ectopic  0   Multiple  0   Live Births  0            Home Medications    Prior to Admission medications   Medication Sig Start Date End Date Taking? Authorizing Provider  ibuprofen (ADVIL,MOTRIN) 200 MG tablet Take 200 mg by mouth every 6 (six) hours as needed for moderate pain.   Yes [provider]  Multiple Vitamin (MULTIVITAMIN WITH MINERALS) TABS tablet Take 1 tablet by mouth daily.   Yes [provider]  ferrous sulfate 325 (65 FE) MG tablet Take 1 tablet (325 mg total) by mouth 3 (three) times daily with meals. Patient not taking: Reported on 08/14/2017 05/07/17   Degele, Jenne Pane, MD  ondansetron (ZOFRAN ODT) 4 MG disintegrating tablet Take 1 tablet (4 mg total) by mouth every 8 (eight) hours as needed for nausea or vomiting. 07/18/18   Arville Lime, PA-C    Family History Family History  Problem Relation Age of Onset  . Diabetes Mother   . Rheum arthritis Father     Social History Social History   Tobacco Use  . Smoking status: Never Smoker  . Smokeless tobacco: Never Used  Substance Use Topics  . Alcohol use: No  . Drug use: No     Allergies   Patient has no known allergies.   Review of Systems Review of Systems  Constitutional: Negative for activity change, appetite change, chills, fever and unexpected weight change.  HENT: Negative for congestion, rhinorrhea and sore throat.   Eyes: Negative for visual disturbance.  Respiratory: Negative for cough and shortness of breath.   Cardiovascular: Negative for chest pain.  Gastrointestinal: Positive for abdominal pain, diarrhea, nausea and vomiting. Negative for constipation.  Endocrine: Negative for polydipsia, polyphagia and polyuria.  Genitourinary:  Negative for dysuria, flank pain, frequency, vaginal bleeding and vaginal discharge.  Musculoskeletal: Negative for back pain and myalgias.  Skin: Negative for rash.  Allergic/Immunologic: Negative for immunocompromised state.  Neurological: Negative for weakness and light-headedness.  Psychiatric/Behavioral: The patient is not nervous/anxious.    Physical Exam Updated Vital Signs BP 103/67 (BP Location: Left Arm)   Pulse (!) 103   Temp 98 F (36.7 C) (Oral)   Resp 20   Ht 5' 6"  (1.676 m)   Wt 51.3 kg   SpO2 100%   BMI 18.24 kg/m   Physical Exam Vitals signs and nursing note reviewed.  Constitutional:      General: She is not in acute  distress.    Appearance: She is well-developed. She is not diaphoretic.  HENT:     Head: Normocephalic and atraumatic.     Right Ear: Tympanic membrane, ear canal and external ear normal.     Left Ear: Tympanic membrane, ear canal and external ear normal.     Nose: Nose normal.     Mouth/Throat:     Mouth: Mucous membranes are moist.     Pharynx: No oropharyngeal exudate or posterior oropharyngeal erythema.  Eyes:     Extraocular Movements: Extraocular movements intact.     Conjunctiva/sclera: Conjunctivae normal.     Pupils: Pupils are equal, round, and reactive to light.  Neck:     Musculoskeletal: Normal range of motion and neck supple.  Cardiovascular:     Rate and Rhythm: Normal rate and regular rhythm.     Heart sounds: Normal heart sounds. No murmur. No friction rub. No gallop.   Pulmonary:     Effort: Pulmonary effort is normal. No respiratory distress.     Breath sounds: Normal breath sounds. No wheezing or rales.  Abdominal:     General: Bowel sounds are normal. There is no distension.     Palpations: Abdomen is soft. Abdomen is not rigid. There is no mass.     Tenderness: There is no abdominal tenderness. There is no right CVA tenderness, left CVA tenderness, guarding or rebound.     Hernia: No hernia is present.  Musculoskeletal: Normal range of motion.  Skin:    Findings: No rash.  Neurological:     Mental Status: She is alert.      ED Treatments / Results  Labs (all labs ordered are listed, but only abnormal results are displayed) Labs Reviewed  COMPREHENSIVE METABOLIC PANEL - Abnormal; Notable for the following components:      Result Value   Calcium 8.5 (*)    All other components within normal limits  I-STAT BETA HCG BLOOD, ED ([facility], [facility], [facility] ONLY) - Abnormal; Notable for the following components:   I-stat hCG, quantitative 1,026.1 (*)    All other components within normal limits  CBC  LIPASE, BLOOD    EKG None  Radiology US Ob  Transvaginal  Result Date: 07/18/2018 CLINICAL DATA:  Abdominal cramping and vomiting 8 days post abortion. EXAM: OBSTETRIC <14 WK [place] AND TRANSVAGINAL OB US TECHNIQUE: Both transabdominal and transvaginal ultrasound examinations were performed for complete evaluation of the gestation as well as the maternal uterus, adnexal regions, and pelvic cul-de-sac. Transvaginal technique was performed to assess early pregnancy. COMPARISON:  None. FINDINGS: Intrauterine gestational sac: None Maternal uterus/adnexae: There is trace fluid in the endometrial canal. Endometrial thickness is 7 mm, normal. The bilateral ovaries are unremarkable in appearance. No free fluid in the pelvis. IMPRESSION: 1. Trace fluid in the endometrial  canal without evidence of retained products of conception. Electronically Signed   By: Titus Dubin M.D.   On: 07/18/2018 08:36    Procedures Procedures (including critical care time)  Medications Ordered in ED Medications  sodium chloride 0.9 % bolus 1,000 mL (0 mLs Intravenous Stopped 07/18/18 0642)  ondansetron (ZOFRAN) injection 4 mg (4 mg Intravenous Given 07/18/18 0541)     Initial Impression / Assessment and Plan / ED Course  I have reviewed the triage vital signs and the nursing notes.  Pertinent labs & imaging results that were available during my care of the patient were reviewed by me and considered in my medical decision making (see chart for details).  Clinical Course as of Jul 18 1053  Sat Jul 18, 2018  9323 Trace fluid in the endometrial canal without evidence of retained products of conception.    US OB Transvaginal [AH]    Clinical Course User Index [AH] Arville Lime, PA-C      Patient presents with nausea, vomiting, and diarrhea. Suspect symptoms are likely due to viral gastroenteritis. Patient is nontoxic, nonseptic appearing, in no apparent distress.  Patient's pain and other symptoms adequately managed in emergency department.  Fluid bolus given.   Labs, imaging and vitals reviewed.  Patient does not meet the SIRS or Sepsis criteria. On exam patient does not have abdominal tenderness. No indication of appendicitis, bowel obstruction, bowel perforation, cholecystitis, or diverticulitis. Symptoms have improved while in the ER. Beta hCG is elevated, likely due to previous D&C on 02/11. Transvaginal ultrasound does not reveal products of conception. Advised patient to follow up with PCP or OBGYN in 2 days. Patient discharged home with symptomatic treatment and given strict instructions for follow-up with their primary care physician.  I have also discussed reasons to return immediately to the ER.  Patient expresses understanding and agrees with plan.  Final Clinical Impressions(s) / ED Diagnoses   Final diagnoses:  Nausea vomiting and diarrhea    ED Discharge Orders         Ordered    ondansetron (ZOFRAN ODT) 4 MG disintegrating tablet  Every 8 hours PRN     07/18/18 1040           Arville Lime, [place] 07/18/18 [place], Kevin, MD 07/18/18 1659

## 2018-07-18 NOTE — ED Triage Notes (Signed)
Patient complains of 4 episodes of emesis and diarrhea with chills. Patient stated she started feeling this way after she ate chick-fil-a around 6 PM. Patient states she has not been able to keep any food or liquids down, vomiting comes after she eats, nausea continues at present.

## 2018-07-18 NOTE — Discharge Instructions (Addendum)
You have been seen today for nausea, vomiting, diarrhea. Please read and follow all provided instructions.   1. Medications: zofran for nausea, usual home medications 2. Treatment: rest, drink plenty of fluids 3. Follow Up: Please follow up with your primary doctor in 2 days for discussion of your diagnoses and further evaluation after today's visit; if you do not have a primary care doctor use the resource guide provided to find one; Please return to the ER for any new or worsening symptoms. Please obtain all of your results from medical records or have your doctors office obtain the results - share them with your doctor - you should be seen at your doctors office. Call today to arrange your follow up.   Take medications as prescribed. Please review all of the medicines and only take them if you do not have an allergy to them. Return to the emergency room for worsening condition or new concerning symptoms. Follow up with your regular doctor. If you don't have a regular doctor use one of the numbers below to establish a primary care doctor.  Please be aware that if you are taking birth control pills, taking other prescriptions, ESPECIALLY ANTIBIOTICS may make the birth control ineffective - if this is the case, either do not engage in sexual activity or use alternative methods of birth control such as condoms until you have finished the medicine and your family doctor says it is OK to restart them. If you are on a blood thinner such as COUMADIN, be aware that any other medicine that you take may cause the coumadin to either work too much, or not enough - you should have your coumadin level rechecked in next 7 days if this is the case.  ?  It is also a possibility that you have an allergic reaction to any of the medicines that you have been prescribed - Everybody reacts differently to medications and while MOST people have no trouble with most medicines, you may have a reaction such as nausea, vomiting,  rash, swelling, shortness of breath. If this is the case, please stop taking the medicine immediately and contact your physician.  ?  You should return to the ER if you develop severe or worsening symptoms.   Emergency Department Resource Guide 1) Find a Doctor and Pay Out of Pocket Although you won't have to find out who is covered by your insurance plan, it is a good idea to ask around and get recommendations. You will then need to call the office and see if the doctor you have chosen will accept you as a new patient and what types of options they offer for patients who are self-pay. Some doctors offer discounts or will set up payment plans for their patients who do not have insurance, but you will need to ask so you aren't surprised when you get to your appointment.  2) Contact Your Local Health Department Not all health departments have doctors that can see patients for sick visits, but many do, so it is worth a call to see if yours does. If you don't know where your local health department is, you can check in your phone book. The CDC also has a tool to help you locate your state's health department, and many state websites also have listings of all of their local health departments.  3) Find a Walk-in Clinic If your illness is not likely to be very severe or complicated, you may want to try a walk in clinic. These are  popping up all over the country in pharmacies, drugstores, and shopping centers. They're usually staffed by nurse practitioners or physician assistants that have been trained to treat common illnesses and complaints. They're usually fairly quick and inexpensive. However, if you have serious medical issues or chronic medical problems, these are probably not your best option.  No Primary Care Doctor: Call Health Connect at  [phone] - they can help you locate a primary care doctor that  accepts your insurance, provides certain services, etc. Physician Referral [phone]  Emergency Department Resource Guide 1) Find a Doctor and Pay Out of Pocket Although you won't have to find out who is covered by your insurance plan, it is a good idea to ask around and get recommendations. You will then need to call the office and see if the doctor you have chosen will accept you as a new patient and what types of options they offer for patients who are self-pay. Some doctors offer discounts or will set up payment plans for their patients who do not have insurance, but you will need to ask so you aren't surprised when you get to your appointment.  2) Contact Your Local Health Department Not all health departments have doctors that can see patients for sick visits, but many do, so it is worth a call to see if yours does. If you don't know where your local health department is, you can check in your phone book. The CDC also has a tool to help you locate your state's health department, and many state websites also have listings of all of their local health departments.  3) Find a [facility] If your illness is not likely to be very severe or complicated, you may want to try a walk in clinic. These are popping up all over the country in pharmacies, drugstores, and shopping centers. They're usually staffed by nurse practitioners or physician assistants that have been trained to treat common illnesses and complaints. They're usually fairly quick and inexpensive. However, if you have serious medical issues or chronic medical problems, these are probably not your best option.  No Primary Care Doctor: Call Health Connect at  [phone] - they can help you locate a primary care doctor that  accepts your insurance, provides certain services, etc. Physician Referral Service- [phone]  Chronic Pain Problems: Organization         Address  Phone   Notes  [facility]  [phone] Patients need to be referred by their primary care doctor.    Medication Assistance: Organization         Address  Phone   Notes  [facility] Medication [facility] [address] [place], [place] [address] [phone] --Must be a resident of [facility] -- Must have NO insurance coverage whatsoever (no Medicaid/ Medicare, etc.) -- The pt. MUST have a primary care doctor that directs their care regularly and follows them in the community   [facility]  [phone]   [employer]  [phone]    Agencies that provide inexpensive medical care: Organization         Address  Phone   Notes  [place]  [phone]   [facility]    [phone]   [facility], [place] [address] [phone]   [place] [address] [place] [phone]   [facility]    [phone])  [facility]    [phone]   [address], [place] Phone:  [phone], Fax:  [phone] Hours of Operation:  9 am - 6 pm, M-F.  Also accepts Medicaid/Medicare and self-pay.  [facility] for [place] [address], [address], [place] Phone: [phone], Fax: [phone]. Hours of Operation:  8:30 am - 5:30 pm, M-F.  Also accepts Medicaid and self-pay.  [facility] [address] [place] Phone: [phone]   [place], [place], [place] [phone], Ext. [phone] Mondays & Thursdays: 7-9 AM.  First 15 patients are seen on a first come, first serve basis.    [place] Providers:  Organization         Address  Phone   Notes  [facility] [address], [place] [phone] Also accepts self-pay patients.  [facility] [address], [place]  [phone]   [place], [address], [place] [phone]   [facility] [address], [place] [phone]   Lucianne Lei [address], [place]   [phone] Only accepts [place] [facility] [place] patients after they have their name applied to their card.   Self-Pay (no insurance) in [facility]:  Organization         Address  Phone   Notes  Sickle Cell Patients, [facility] [place] [phone]   [facility] [place] [phone]   [facility] [place]  [place], [place], [place] [phone]   [facility]/Dr. Osei-Bonsu  [address], [place] or [address], [place] [phone] Phone number for both [place] and [place] locations is the same.  [facility] and [facility] [address] [place] [phone]   [facility] [address] [place] or [address] [phone]   [facility] [address], [place] [phone], phone; [phone], fax Sees patients 1st and 3rd Saturday of every month.  Must not qualify for public or private insurance (i.e. Medicaid, Medicare, [place] [employer], Veterans' Benefits)  Household income should be no more than 200% of the poverty level The clinic cannot treat you if you are pregnant or think you are pregnant  Sexually transmitted diseases are not treated at the clinic.

## 2018-11-20 IMAGING — US US MFM OB COMP +14 WKS
1 series · 14 of 28 positions shown · non-contrast
Comparison: none

[Series 1: us mfm ob comp +14 wks · 14 of 75 slices shown]
[im 3/75]
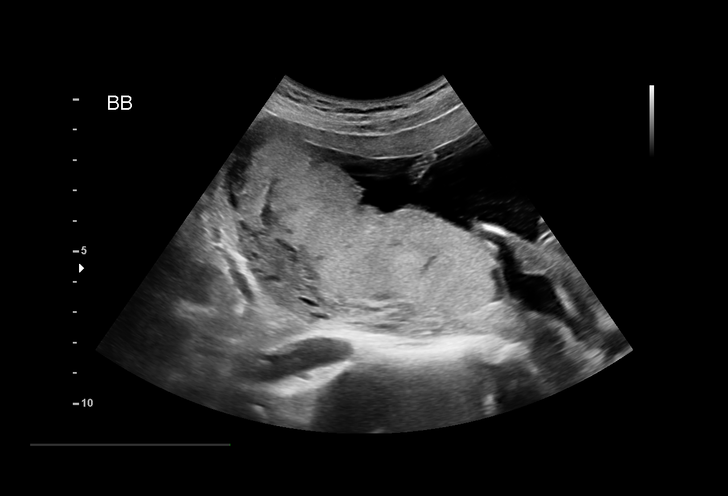
[im 9/75]
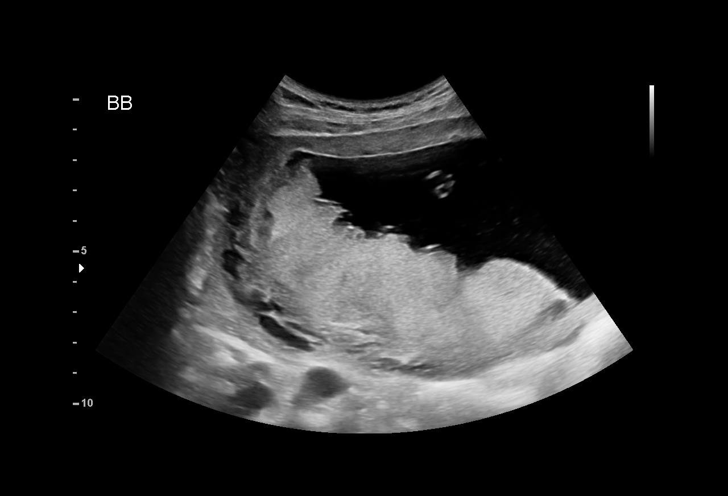
[im 14/75]
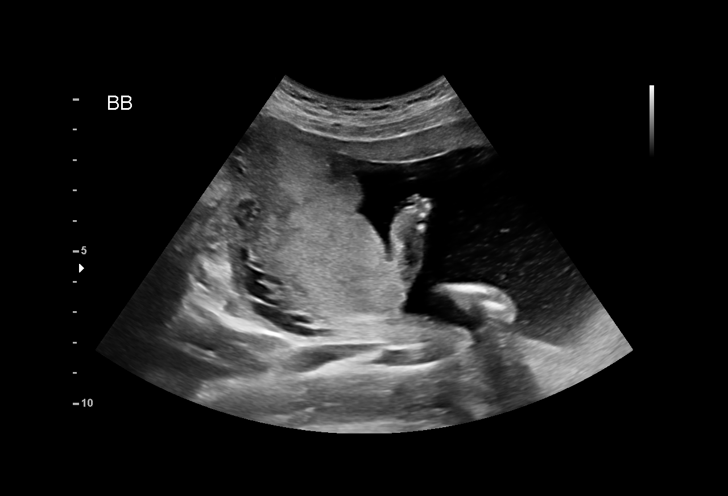
[im 20/75]
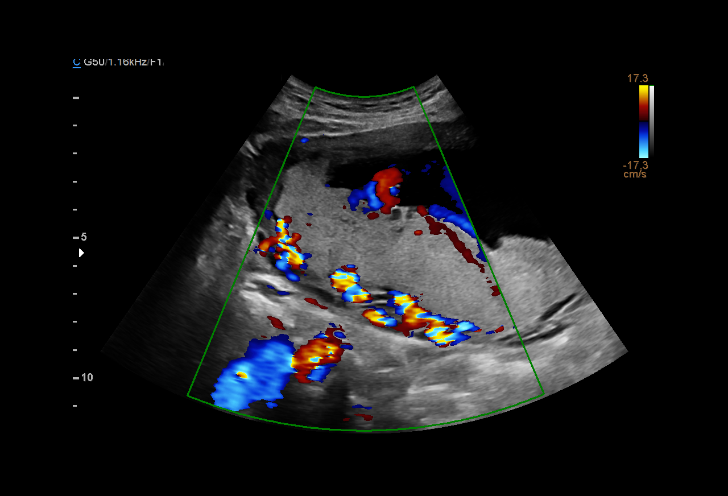
[im 25/75]
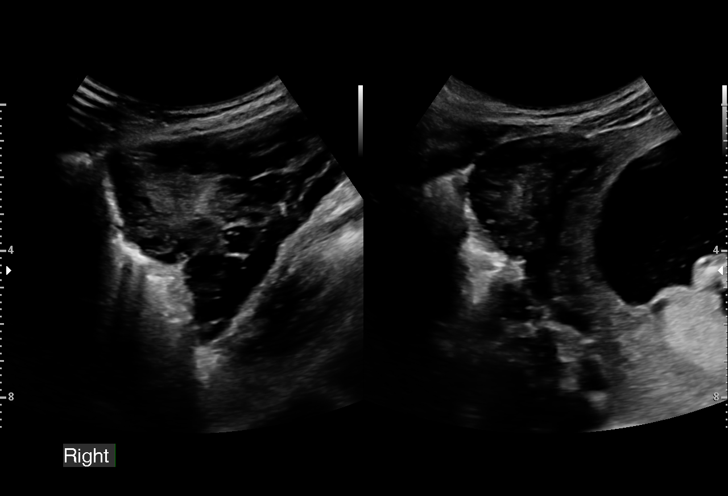
[im 31/75]
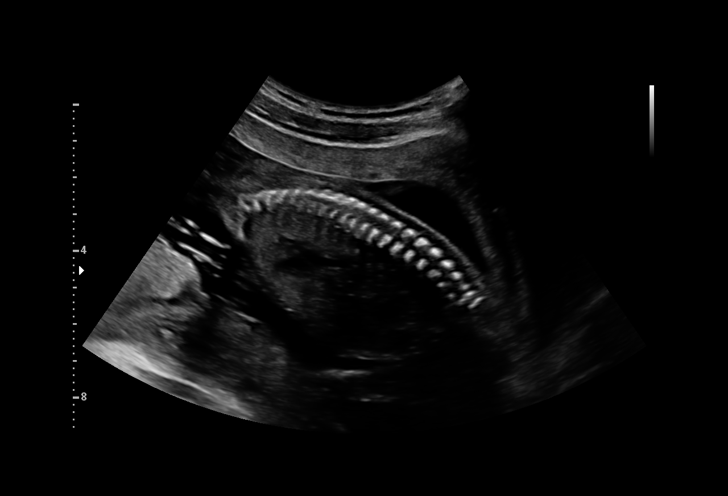
[im 36/75]
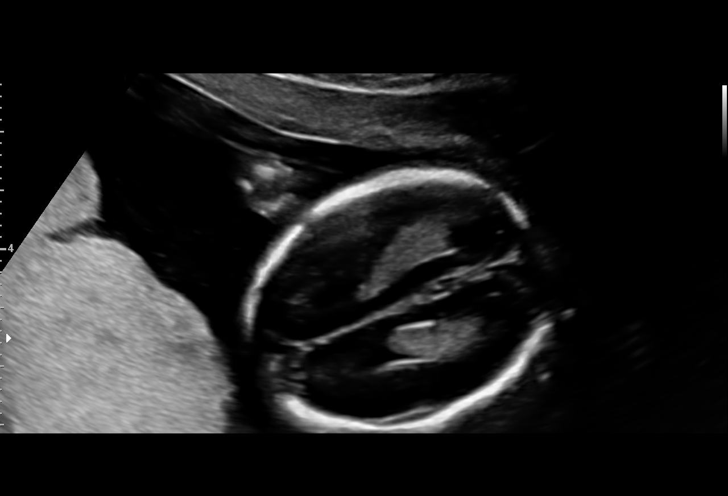
[im 42/75]
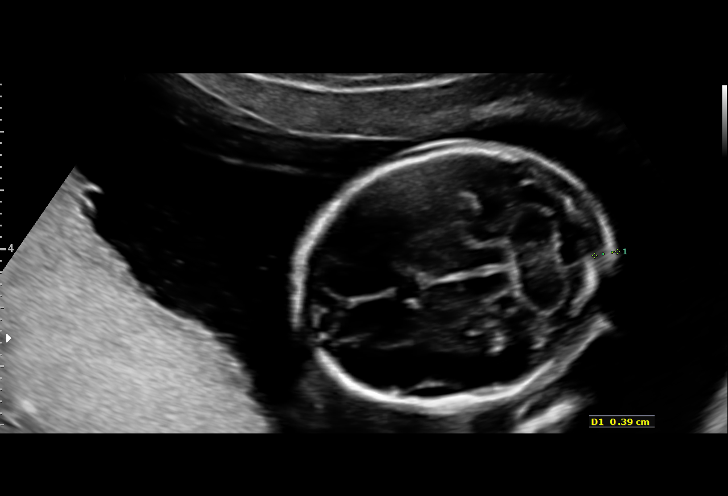
[im 47/75]
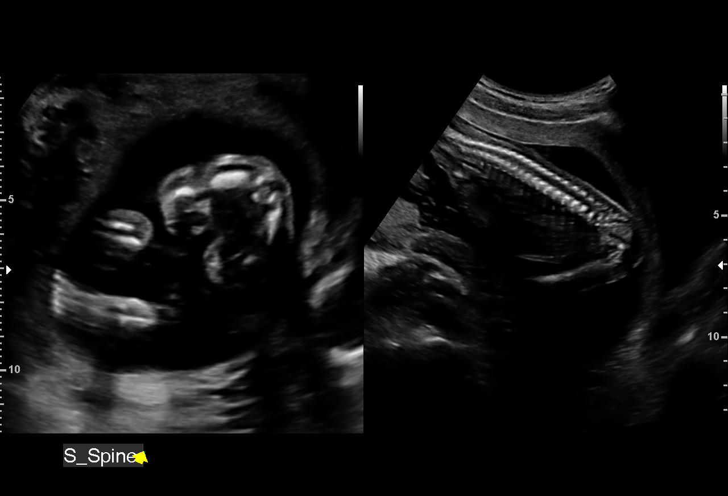
[im 53/75]
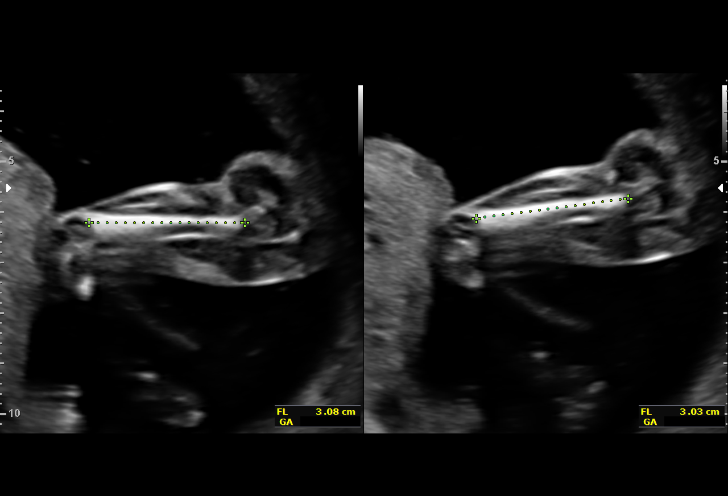
[im 58/75]
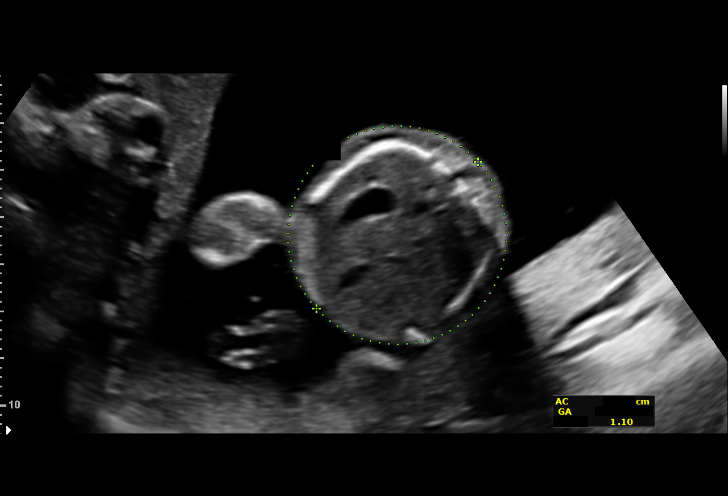
[im 64/75]
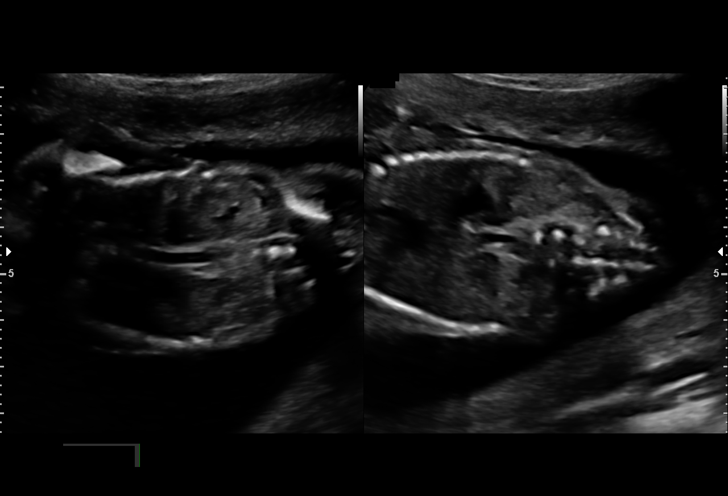
[im 69/75]
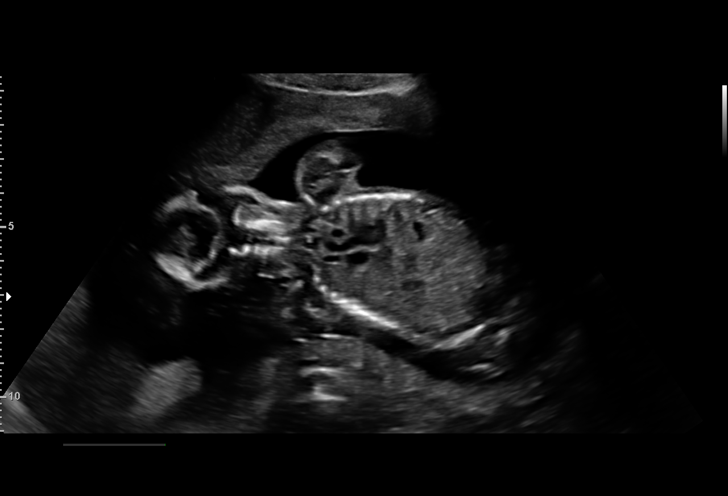
[im 75/75]
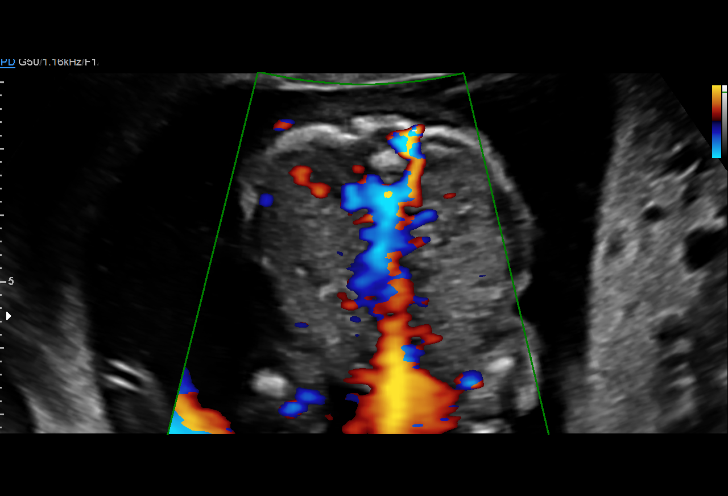

[14 of 28 positions shown; findings below may reference images not displayed]

OB/Gyn Clinic

1  JAILYN KAI           [id number]      [id number]     [id number]
Indications

19 weeks gestation of pregnancy
Encounter for uncertain dates
Encounter for fetal anatomic survey
OB History

Gravidity:    2         Term:   0        Prem:   0        SAB:   1
TOP:          0       Ectopic:  0        Living: 0
Fetal Evaluation

Num Of Fetuses:     1
Fetal Heart         138
Rate(bpm):
Cardiac Activity:   Observed
Presentation:       Breech
Placenta:           Posterior, above cervical os
P. Cord Insertion:  Visualized

Amniotic Fluid
AFI FV:      Subjectively within normal limits

Largest Pocket(cm)
4.28
Biometry
BPD:      42.1  mm     G. Age:  18w 5d         27  %    CI:        69.35   %   70 - 86
FL/HC:      18.9   %   16.1 -
HC:      161.4  mm     G. Age:  19w 0d         26  %    HC/AC:      1.11       1.09 -
AC:      145.3  mm     G. Age:  19w 6d         64  %    FL/BPD:     72.4   %
FL:       30.5  mm     G. Age:  19w 3d         48  %    FL/AC:      21.0   %   20 - 24
HUM:      29.2  mm     G. Age:  19w 4d         57  %
Est. FW:     299  gm    0 lb 11 oz      51  %
Gestational Age

LMP:           15w 0d       Date:   11/01/16                 EDD:   08/08/17
U/S Today:     19w 2d                                        EDD:   07/09/17
Best:          19w 2d    Det. By:   U/S (02/14/17)           EDD:   07/09/17
Anatomy

Cranium:               Appears normal         Aortic Arch:            Appears normal
Cavum:                 Appears normal         Ductal Arch:            Appears normal
Ventricles:            Appears normal         Diaphragm:              Not well visualized
Choroid Plexus:        Appears normal         Stomach:                Appears normal, left
sided
Cerebellum:            Appears normal         Abdomen:                Appears normal
Posterior Fossa:       Appears normal         Abdominal Wall:         Appears nml (cord
insert, abd wall)
Nuchal Fold:           Appears normal         Cord Vessels:           Appears normal (3
vessel cord)
Face:                  Orbits nl; profile not Kidneys:                Appear normal
well visualized
Lips:                  Appears normal         Bladder:                Appears normal
Thoracic:              Appears normal         Spine:                  Appears normal
Heart:                 Appears normal         Upper Extremities:      Appears normal
(4CH, axis, and
situs)
RVOT:                  Appears normal         Lower Extremities:      Appears normal
LVOT:                  Appears normal

Other:  Fetus appears to be a female. 5th digit/open hands visualized. Nasal
bone visualized. Technically difficult due to fetal position.
Cervix Uterus Adnexa

Cervix
Length:            3.1  cm.
Normal appearance by transabdominal scan.

Uterus
No abnormality visualized.

Left Ovary
Not visualized.

Right Ovary
Within normal limits.

Cul De Sac:   No free fluid seen.

Adnexa:       No abnormality visualized.
Impression

Singleton intrauterine pregnancy with uncertain dates
Review of the anatomy shows no sonographic markers for
aneuploidy or structural anomalies
Amniotic fluid volume is normal
Biometry suggests an EGA of 19+2 weeks
Estimated fetal weight is 299g which is growth in the 51st
percentile
Recommendations

Recommend follow-up ultrasound examination in 4 weeks to
confirm normal growth and anatomy

## 2020-04-22 IMAGING — US US OB TRANSVAGINAL
1 series · 14 of 28 positions shown · non-contrast
Comparison: None.

CLINICAL DATA: Abdominal cramping and vomiting 8 days post
abortion.

EXAM:
OBSTETRIC <14 WK US AND TRANSVAGINAL OB US
TECHNIQUE: Both transabdominal and transvaginal ultrasound examinations were
performed for complete evaluation of the gestation as well as the
maternal uterus, adnexal regions, and pelvic cul-de-sac.
Transvaginal technique was performed to assess early pregnancy.

[Series 1: us ob transvaginal · 14 of 128 slices shown]
[im 5/128]
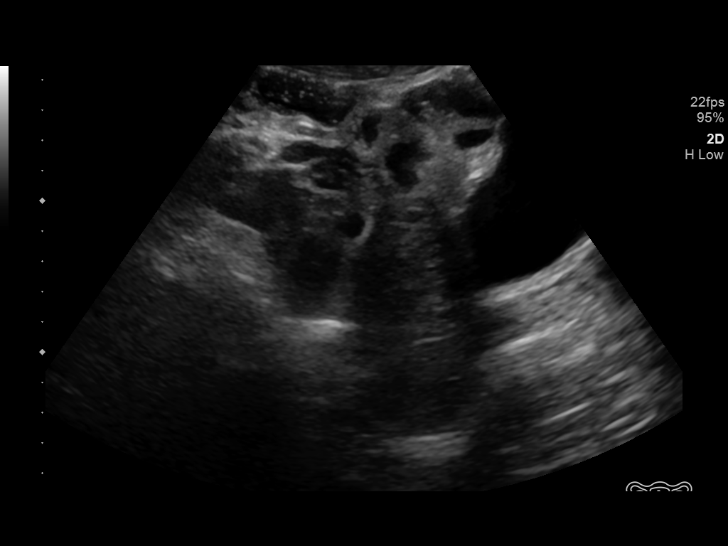
[im 15/128]
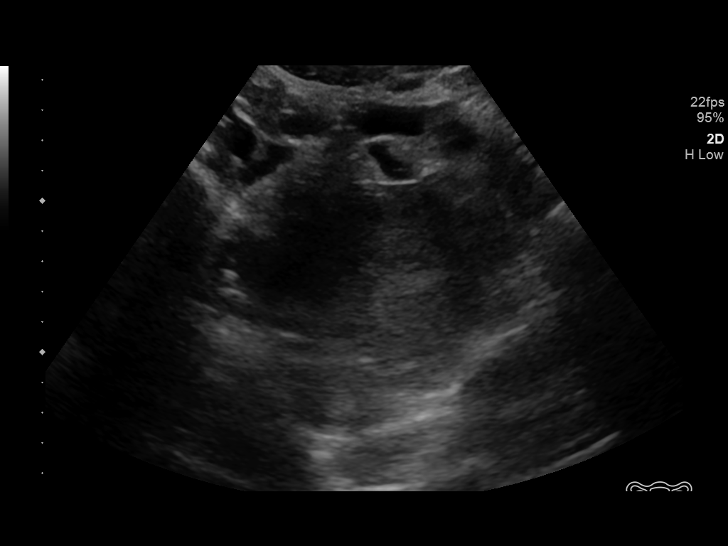
[im 24/128]
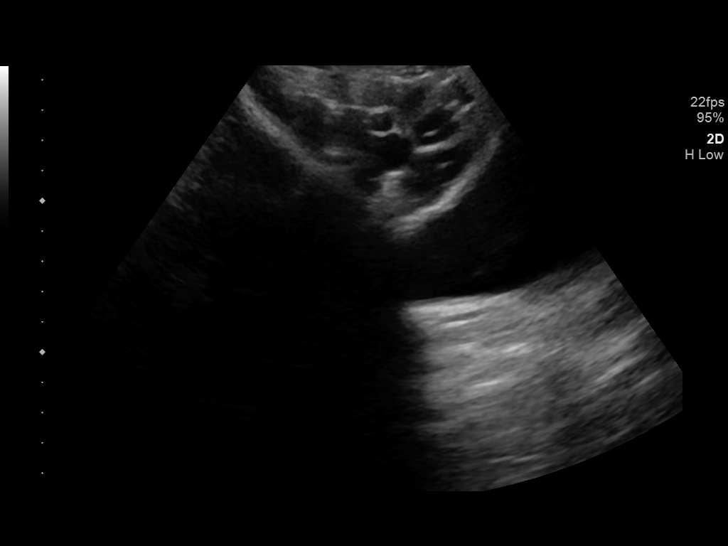
[im 33/128]
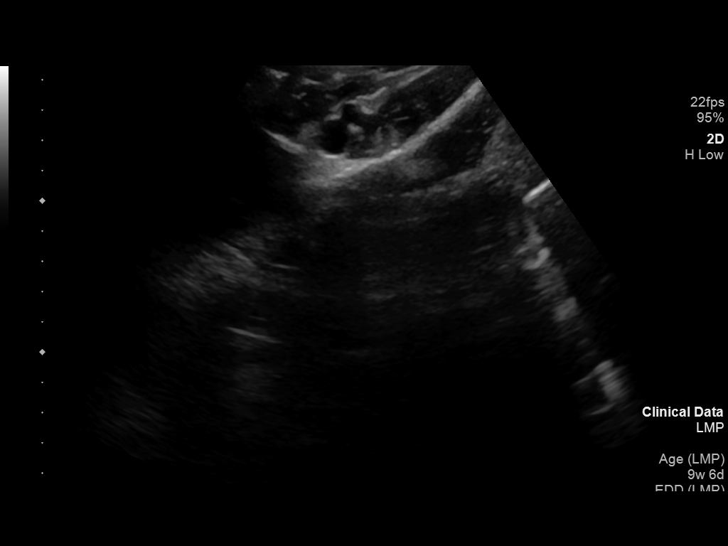
[im 43/128]
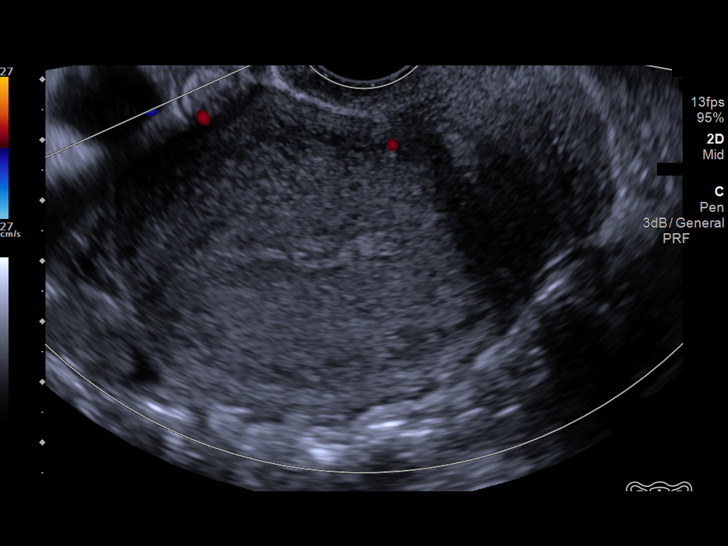
[im 52/128]
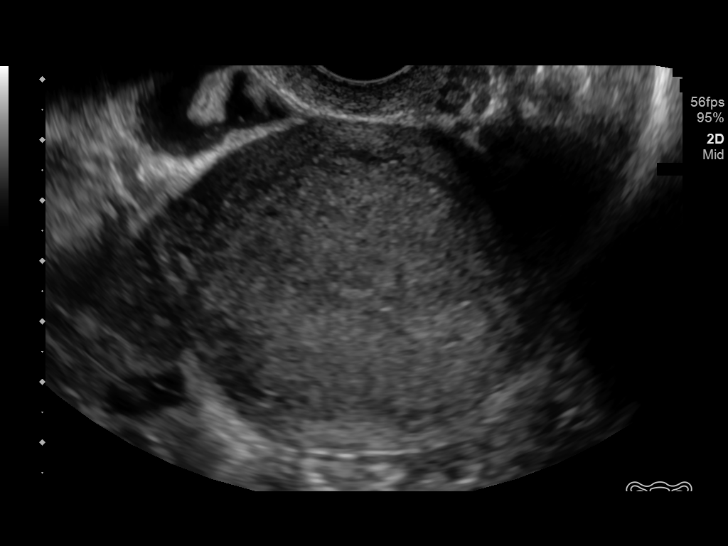
[im 62/128]
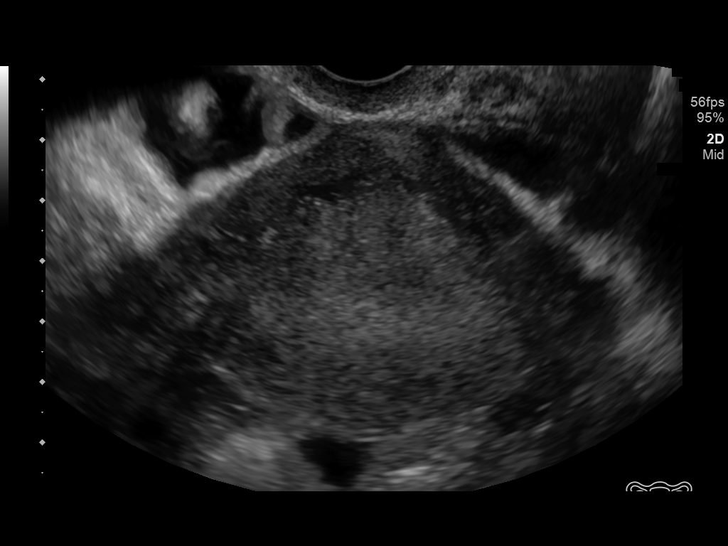
[im 71/128]
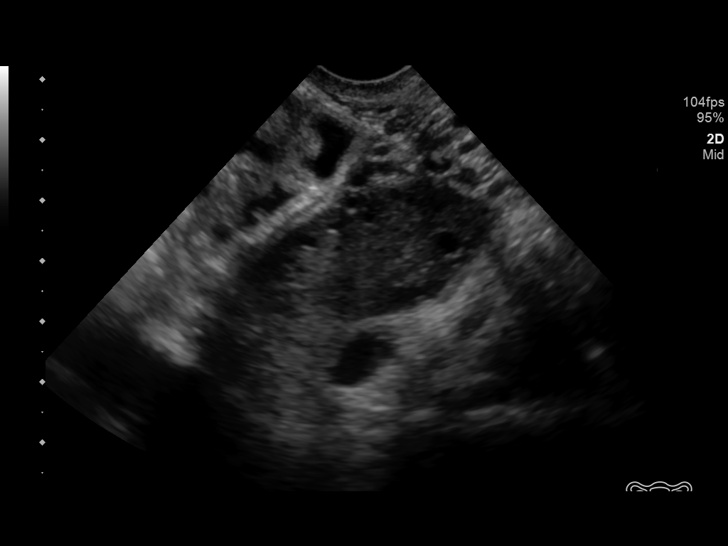
[im 80/128]
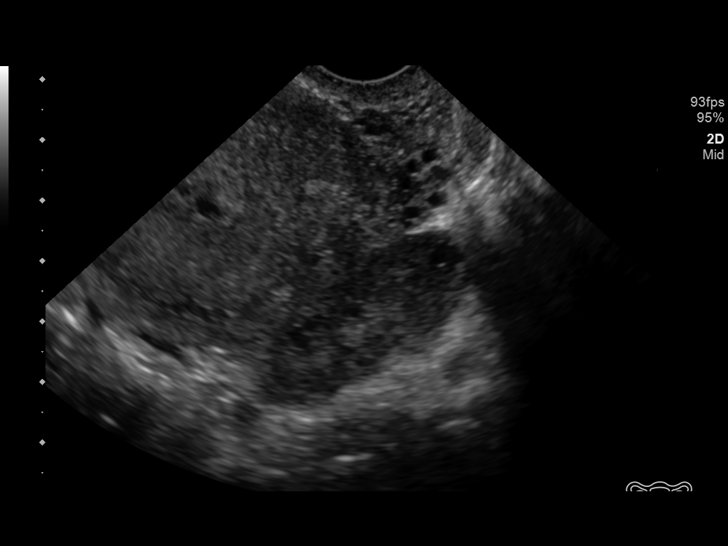
[im 90/128]
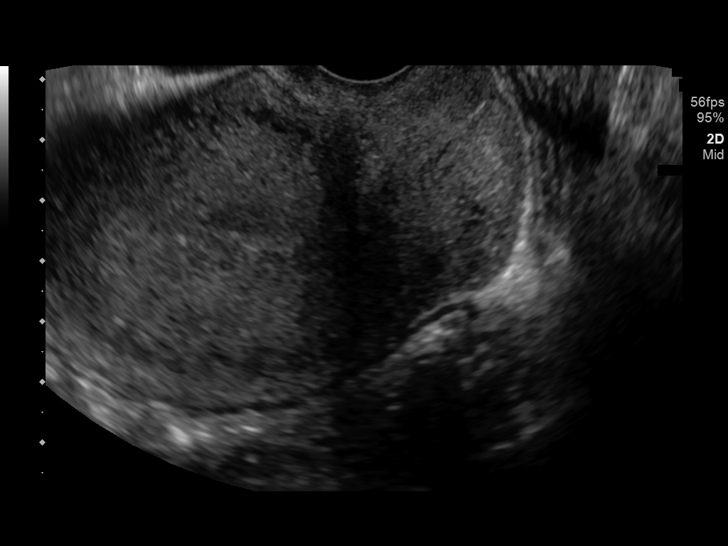
[im 99/128]
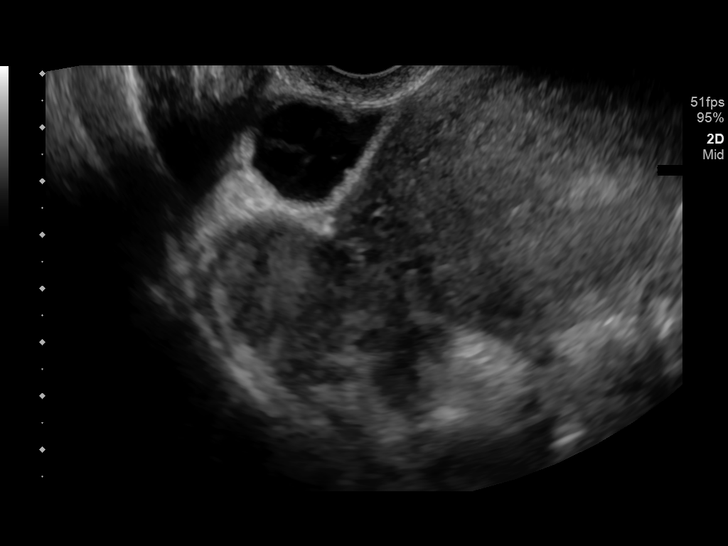
[im 109/128]
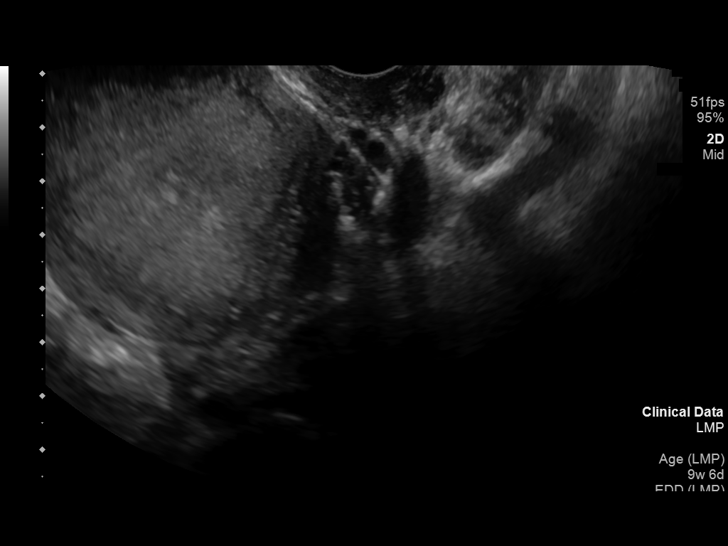
[im 118/128]
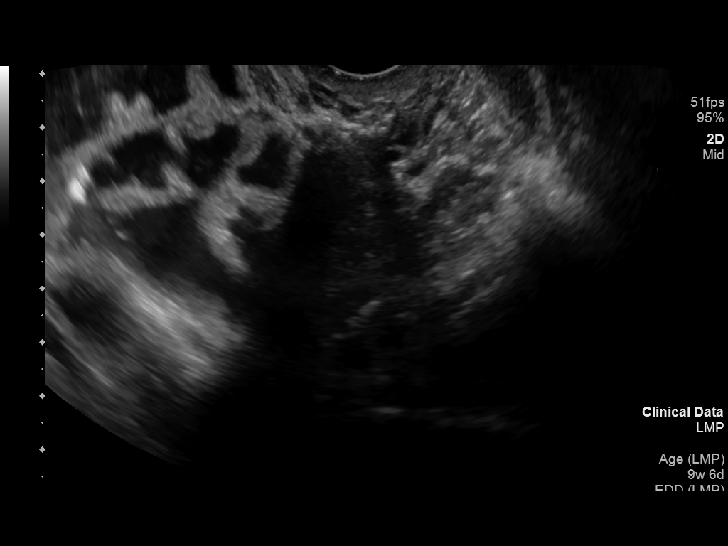
[im 128/128]
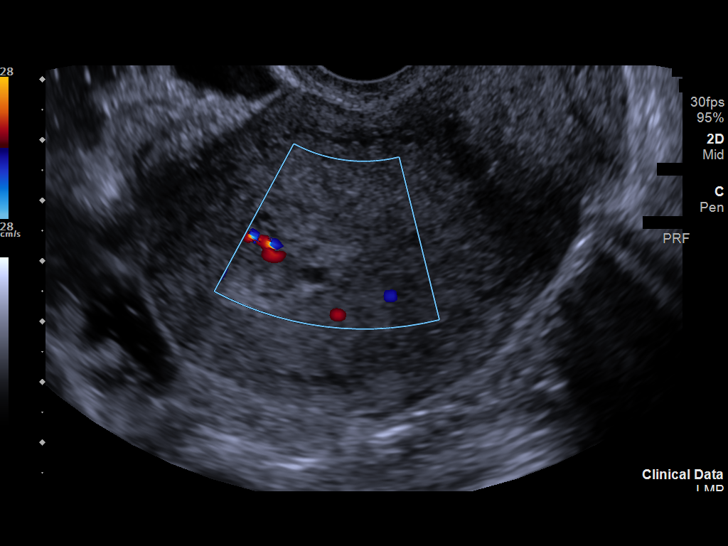

[14 of 28 positions shown; findings below may reference images not displayed]

FINDINGS: Intrauterine gestational sac: None

Maternal uterus/adnexae: There is trace fluid in the endometrial
canal. Endometrial thickness is 7 mm, normal. The bilateral ovaries
are unremarkable in appearance. No free fluid in the pelvis.
IMPRESSION: 1. Trace fluid in the endometrial canal without evidence of retained
products of conception.

## 2020-12-09 ENCOUNTER — Emergency Department (HOSPITAL_COMMUNITY): Payer: Self-pay

## 2020-12-09 ENCOUNTER — Other Ambulatory Visit: Payer: Self-pay

## 2020-12-09 ENCOUNTER — Emergency Department (HOSPITAL_COMMUNITY)
Admission: EM | Admit: 2020-12-09 | Discharge: 2020-12-10 | Disposition: A | Discharge status: Home / Self Care | Payer: Self-pay | Attending: Emergency Medicine | Admitting: Emergency Medicine

## 2020-12-09 ENCOUNTER — Encounter (HOSPITAL_COMMUNITY): Payer: Self-pay

## 2020-12-09 DIAGNOSIS — R102 Pelvic and perineal pain: Secondary | ICD-10-CM | POA: Insufficient documentation

## 2020-12-09 DIAGNOSIS — N939 Abnormal uterine and vaginal bleeding, unspecified: Secondary | ICD-10-CM | POA: Insufficient documentation

## 2020-12-09 LAB — URINALYSIS, ROUTINE W REFLEX MICROSCOPIC
Bilirubin Urine: NEGATIVE
Glucose, UA: NEGATIVE mg/dL
Hgb urine dipstick: NEGATIVE
Ketones, ur: NEGATIVE mg/dL
Nitrite: NEGATIVE
Protein, ur: NEGATIVE mg/dL
Specific Gravity, Urine: 1.014 (ref 1.005–1.030)
pH: 5 (ref 5.0–8.0)

## 2020-12-09 LAB — COMPREHENSIVE METABOLIC PANEL
ALT: 14 U/L (ref 0–44)
AST: 17 U/L (ref 15–41)
Albumin: 4.2 g/dL (ref 3.5–5.0)
Alkaline Phosphatase: 54 U/L (ref 38–126)
Anion gap: 7 (ref 5–15)
BUN: 12 mg/dL (ref 6–20)
CO2: 26 mmol/L (ref 22–32)
Calcium: 9.6 mg/dL (ref 8.9–10.3)
Chloride: 104 mmol/L (ref 98–111)
Creatinine, Ser: 0.46 mg/dL (ref 0.44–1.00)
GFR, Estimated: >60 mL/min (ref >60)
Glucose, Bld: 84 mg/dL (ref 70–99)
Potassium: 3.4 mmol/L — ABNORMAL LOW (ref 3.5–5.1)
Sodium: 137 mmol/L (ref 135–145)
Total Bilirubin: 0.4 mg/dL (ref 0.3–1.2)
Total Protein: 8 g/dL (ref 6.5–8.1)

## 2020-12-09 LAB — I-STAT BETA HCG BLOOD, ED (MC, WL, AP ONLY): I-stat hCG, quantitative: <5 m[IU]/mL (ref <5)

## 2020-12-09 LAB — CBC
HCT: 35.9 % — ABNORMAL LOW (ref 36.0–46.0)
Hemoglobin: 12.2 g/dL (ref 12.0–15.0)
MCH: 29.4 pg (ref 26.0–34.0)
MCHC: 34 g/dL (ref 30.0–36.0)
MCV: 86.5 fL (ref 80.0–100.0)
Platelets: 224 10*3/uL (ref 150–400)
RBC: 4.15 MIL/uL (ref 3.87–5.11)
RDW: 13 % (ref 11.5–15.5)
WBC: 5.6 10*3/uL (ref 4.0–10.5)
nRBC: 0 % (ref 0.0–0.2)

## 2020-12-09 LAB — LIPASE, BLOOD: Lipase: 43 U/L (ref 11–51)

## 2020-12-09 NOTE — ED Triage Notes (Addendum)
Pt reports being diagnosed with UTI a little over a week ago and given antibiotics. She reports finishing antibiotics yesterday. Now endorses increased lower abdominal pain and vaginal bleeding.

## 2020-12-09 NOTE — ED Provider Notes (Signed)
[facility]-EMERGENCY DEPT Provider Note   CSN: [id number] Arrival date & time: 12/09/20  2151     History Chief Complaint  Patient presents with   Abdominal Pain   Vaginal Bleeding    Megan Strong is a 25 y.o. female.  Patient presents to the emergency department with a chief complaint of pelvic pain and vaginal bleeding.  She states that the symptoms started a day or 2 ago.  She reports that she has recently recovered from a UTI.  She states that she also recently finished her menstrual cycle.  She states that she has been using about 1 pad/tampon per day.  She reports crampy lower abdominal pain.  She denies any fevers or chills.  Denies any other associated symptoms.  The history is provided by the patient. No language interpreter was used.      Past Medical History:  Diagnosis Date   Ovarian cyst 2017    Patient Active Problem List   Diagnosis Date Noted   Sickle cell trait (HCC) 02/18/2017    Past Surgical History:  Procedure Laterality Date   NO PAST SURGERIES     THERAPEUTIC ABORTION       OB History     Gravida  2   Para  0   Term  0   Preterm  0   AB  1   Living  0      SAB  0   IAB  0   Ectopic  0   Multiple  0   Live Births  0           Family History  Problem Relation Age of Onset   Diabetes Mother    Rheum arthritis Father     Social History   Tobacco Use   Smoking status: Never   Smokeless tobacco: Never  Vaping Use   Vaping Use: Never used  Substance Use Topics   Alcohol use: No   Drug use: No    Home Medications Prior to Admission medications   Medication Sig Start Date End Date Taking? Authorizing Provider  ferrous sulfate 325 (65 FE) MG tablet Take 1 tablet (325 mg total) by mouth 3 (three) times daily with meals. Patient not taking: Reported on 08/14/2017 05/07/17   Degele, Kandra Nicolas, MD  ibuprofen (ADVIL,MOTRIN) 200 MG tablet Take 200 mg by mouth every 6 (six) hours as needed for  moderate pain.    [provider]  Multiple Vitamin (MULTIVITAMIN WITH MINERALS) TABS tablet Take 1 tablet by mouth daily.    [provider]  ondansetron (ZOFRAN ODT) 4 MG disintegrating tablet Take 1 tablet (4 mg total) by mouth every 8 (eight) hours as needed for nausea or vomiting. 07/18/18   Leretha Dykes, PA-C    Allergies    Patient has no known allergies.  Review of Systems   Review of Systems  All other systems reviewed and are negative.  Physical Exam Updated Vital Signs BP 103/74   Pulse 74   Temp 98.9 F (37.2 C) (Oral)   Resp 15   LMP 11/30/2020   SpO2 98%   Physical Exam Vitals and nursing note reviewed.  Constitutional:      General: She is not in acute distress.    Appearance: She is well-developed.  HENT:     Head: Normocephalic and atraumatic.  Eyes:     Conjunctiva/sclera: Conjunctivae normal.  Cardiovascular:     Rate and Rhythm: Normal rate and regular rhythm.  Heart sounds: No murmur heard. Pulmonary:     Effort: Pulmonary effort is normal. No respiratory distress.     Breath sounds: Normal breath sounds.  Abdominal:     Palpations: Abdomen is soft.     Tenderness: There is abdominal tenderness.     Comments: Mild lower abdominal/pelvic TTP  Musculoskeletal:        General: Normal range of motion.     Cervical back: Neck supple.  Skin:    General: Skin is warm and dry.  Neurological:     Mental Status: She is alert and oriented to person, place, and time.  Psychiatric:        Mood and Affect: Mood normal.        Behavior: Behavior normal.    ED Results / Procedures / Treatments   Labs (all labs ordered are listed, but only abnormal results are displayed) Labs Reviewed  CBC - Abnormal; Notable for the following components:      Result Value   HCT 35.9 (*)    All other components within normal limits  LIPASE, BLOOD  COMPREHENSIVE METABOLIC PANEL  URINALYSIS, ROUTINE W REFLEX MICROSCOPIC  I-STAT BETA HCG BLOOD,  ED ([facility], [facility], [facility] ONLY)    EKG None  Radiology No results found.  Procedures Procedures   Medications Ordered in ED Medications - No data to display  ED Course  I have reviewed the triage vital signs and the nursing notes.  Pertinent labs & imaging results that were available during my care of the patient were reviewed by me and considered in my medical decision making (see chart for details).    MDM Rules/Calculators/A&P                          Patient here with vaginal bleeding.  States that she recently finished her period.  Now she is complaining of some slight vaginal bleeding.  Pregnancy test negative.  She does report some crampy lower abdominal pain.  Ultrasound was ordered, but reveals no concerning findings.  Negative for torsion, fibroids, or cysts.  Laboratory work-up is reassuring.  Hemoglobin is stable.  Vital signs are stable.  Patient is in no acute distress.  Feel that patient is stable for discharge and outpatient follow-up. Final Clinical Impression(s) / ED Diagnoses Final diagnoses:  Abnormal vaginal bleeding    Rx / DC Orders ED Discharge Orders     None        Roxy Horseman, PA-C 12/10/20 0056    Molpus, Jonny Ruiz, MD 12/10/20 [phone]

## 2020-12-10 NOTE — Discharge Instructions (Addendum)
Please follow-up with your OB/GYN for your abnormal uterine bleeding.  If your symptoms change or worsen, please return to the emergency department.
